# Patient Record
Sex: Male | Born: 1960 | Race: White | Hispanic: No | Marital: Married | Smoking: Former smoker
Health system: Southern US, Community
[De-identification: ages and names within clinical notes are randomized; demographics above are authoritative.]

## PROBLEM LIST (undated history)

## (undated) DIAGNOSIS — Z789 Other specified health status: Secondary | ICD-10-CM

## (undated) DIAGNOSIS — I10 Essential (primary) hypertension: Secondary | ICD-10-CM

## (undated) HISTORY — PX: NO PAST SURGERIES: SHX2092

---
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
## 2006-02-06 ENCOUNTER — Emergency Department (HOSPITAL_COMMUNITY): Admission: EM | Admit: 2006-02-06 | Discharge: 2006-02-06 | Payer: Self-pay | Admitting: Emergency Medicine

## 2010-08-26 ENCOUNTER — Ambulatory Visit (HOSPITAL_COMMUNITY): Admission: RE | Admit: 2010-08-26 | Discharge: 2010-08-26 | Payer: Self-pay | Admitting: Family Medicine

## 2010-09-01 ENCOUNTER — Ambulatory Visit (HOSPITAL_COMMUNITY): Admission: RE | Admit: 2010-09-01 | Discharge: 2010-09-01 | Payer: Self-pay | Admitting: Family Medicine

## 2011-03-18 ENCOUNTER — Other Ambulatory Visit: Payer: Self-pay | Admitting: Family Medicine

## 2011-03-18 DIAGNOSIS — R911 Solitary pulmonary nodule: Secondary | ICD-10-CM

## 2011-03-23 ENCOUNTER — Ambulatory Visit (HOSPITAL_COMMUNITY)
Admission: RE | Admit: 2011-03-23 | Discharge: 2011-03-23 | Disposition: A | Discharge status: Home / Self Care | Payer: BC Managed Care – PPO | Source: Ambulatory Visit | Attending: Family Medicine | Admitting: Family Medicine

## 2011-03-23 DIAGNOSIS — J984 Other disorders of lung: Secondary | ICD-10-CM | POA: Insufficient documentation

## 2011-03-23 DIAGNOSIS — R911 Solitary pulmonary nodule: Secondary | ICD-10-CM

## 2011-03-23 DIAGNOSIS — Z09 Encounter for follow-up examination after completed treatment for conditions other than malignant neoplasm: Secondary | ICD-10-CM | POA: Insufficient documentation

## 2011-03-23 MED ORDER — IOHEXOL 300 MG/ML  SOLN
80.0000 mL | Freq: Once | INTRAMUSCULAR | Status: AC | PRN
  Administered 2011-03-23: 80 mL via INTRAVENOUS

## 2012-05-18 ENCOUNTER — Other Ambulatory Visit: Payer: Self-pay | Admitting: Family Medicine

## 2012-05-18 DIAGNOSIS — R222 Localized swelling, mass and lump, trunk: Secondary | ICD-10-CM

## 2012-05-26 ENCOUNTER — Ambulatory Visit (HOSPITAL_COMMUNITY)
Admission: RE | Admit: 2012-05-26 | Discharge: 2012-05-26 | Disposition: A | Discharge status: Home / Self Care | Payer: BC Managed Care – PPO | Source: Ambulatory Visit | Attending: Family Medicine | Admitting: Family Medicine

## 2012-05-26 DIAGNOSIS — R222 Localized swelling, mass and lump, trunk: Secondary | ICD-10-CM

## 2012-05-26 DIAGNOSIS — R911 Solitary pulmonary nodule: Secondary | ICD-10-CM | POA: Insufficient documentation

## 2012-05-26 DIAGNOSIS — R937 Abnormal findings on diagnostic imaging of other parts of musculoskeletal system: Secondary | ICD-10-CM | POA: Insufficient documentation

## 2012-05-26 MED ORDER — IOHEXOL 300 MG/ML  SOLN
80.0000 mL | Freq: Once | INTRAMUSCULAR | Status: AC | PRN
  Administered 2012-05-26: 80 mL via INTRAVENOUS

## 2014-06-20 ENCOUNTER — Encounter: Payer: Self-pay | Admitting: Family Medicine

## 2014-06-20 ENCOUNTER — Ambulatory Visit (INDEPENDENT_AMBULATORY_CARE_PROVIDER_SITE_OTHER): Payer: BC Managed Care – PPO | Admitting: Family Medicine

## 2014-06-20 VITALS — BP 138/86 | Temp 98.1°F | Ht 71.0 in | Wt 217.0 lb

## 2014-06-20 DIAGNOSIS — R509 Fever, unspecified: Secondary | ICD-10-CM

## 2014-06-20 MED ORDER — DOXYCYCLINE HYCLATE 100 MG PO TABS: 100.0000 mg | ORAL_TABLET | Freq: Two times a day (BID) | ORAL | Status: DC

## 2014-06-20 NOTE — Progress Notes (Signed)
   Subjective:    Patient ID: Stephen Barnes, male    DOB: [birth year], 53 y.o.   MRN: [id number]  Fever  This is a new problem. The current episode started 1 to 4 weeks ago. The maximum temperature noted was 101 to 101.9 F. He has tried NSAIDs for the symptoms.    Started at Saturday  Mil headache  dinminished energy  Throat's inflammed  No tick bites  Some rasah on the back, question heat bumps,  Tries to keep up on fluids, regular headache  Review of Systems  Constitutional: Positive for fever.   No vomiting no diarrhea no change in bowel habits ROS otherwise negative    Objective:   Physical Exam Alert no apparent distress. Neck supple lungs Barnes. Heart rare rhythm HET normal abdomen benign. 3 nonspecific minimal rash low back       Assessment & Plan:  Impression febrile illness. 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 Discussed. Likely viral. But need to control in cover for tickborne illness plan Doxy 100 twice a day. Symptomatic care discussed. WSL

## 2015-12-03 ENCOUNTER — Telehealth: Payer: Self-pay | Admitting: Family Medicine

## 2015-12-03 DIAGNOSIS — Z125 Encounter for screening for malignant neoplasm of prostate: Secondary | ICD-10-CM

## 2015-12-03 DIAGNOSIS — Z13220 Encounter for screening for lipoid disorders: Secondary | ICD-10-CM

## 2015-12-03 DIAGNOSIS — R5383 Other fatigue: Secondary | ICD-10-CM

## 2015-12-03 NOTE — Telephone Encounter (Signed)
No labs in EPIC

## 2015-12-03 NOTE — Telephone Encounter (Signed)
Fatigue is fine

## 2015-12-03 NOTE — Telephone Encounter (Signed)
Lipid, met 7, PSA, CBC

## 2015-12-03 NOTE — Telephone Encounter (Signed)
Blood work ordered in EPIC. Patient notified. 

## 2015-12-03 NOTE — Telephone Encounter (Signed)
Pt is scheduled for a physical on 12/19/15, can we order lab work for patient to do before appt?  Please advise

## 2015-12-03 NOTE — Telephone Encounter (Signed)
Need dx for cbc please

## 2015-12-12 ENCOUNTER — Encounter: Payer: Self-pay | Admitting: Family Medicine

## 2015-12-12 LAB — CBC WITH DIFFERENTIAL/PLATELET
BASOS: 0 %
Basophils Absolute: 0 10*3/uL (ref 0.0–0.2)
EOS (ABSOLUTE): 0.1 10*3/uL (ref 0.0–0.4)
EOS: 2 %
Hematocrit: 43.2 % (ref 37.5–51.0)
Hemoglobin: 15 g/dL (ref 12.6–17.7)
IMMATURE GRANULOCYTES: 0 %
Immature Grans (Abs): 0 10*3/uL (ref 0.0–0.1)
LYMPHS: 36 %
Lymphocytes Absolute: 2 10*3/uL (ref 0.7–3.1)
MCH: 30.2 pg (ref 26.6–33.0)
MCHC: 34.7 g/dL (ref 31.5–35.7)
MCV: 87 fL (ref 79–97)
MONOCYTES: 7 %
Monocytes Absolute: 0.4 10*3/uL (ref 0.1–0.9)
NEUTROS ABS: 3 10*3/uL (ref 1.4–7.0)
NEUTROS PCT: 55 %
Platelets: 262 10*3/uL (ref 150–379)
RBC: 4.96 x10E6/uL (ref 4.14–5.80)
RDW: 13.9 % (ref 12.3–15.4)
WBC: 5.5 10*3/uL (ref 3.4–10.8)

## 2015-12-12 LAB — PSA: PROSTATE SPECIFIC AG, SERUM: 0.4 ng/mL (ref 0.0–4.0)

## 2015-12-12 LAB — LIPID PANEL
CHOLESTEROL TOTAL: 171 mg/dL (ref 100–199)
Chol/HDL Ratio: 4.6 ratio units (ref 0.0–5.0)
HDL: 37 mg/dL — ABNORMAL LOW (ref >39)
LDL CALC: 118 mg/dL — AB (ref 0–99)
Triglycerides: 81 mg/dL (ref 0–149)
VLDL Cholesterol Cal: 16 mg/dL (ref 5–40)

## 2015-12-12 LAB — BASIC METABOLIC PANEL
BUN / CREAT RATIO: 11 (ref 9–20)
BUN: 12 mg/dL (ref 6–24)
CHLORIDE: 103 mmol/L (ref 96–106)
CO2: 25 mmol/L (ref 18–29)
Calcium: 9.9 mg/dL (ref 8.7–10.2)
Creatinine, Ser: 1.14 mg/dL (ref 0.76–1.27)
GFR calc Af Amer: 84 mL/min/{1.73_m2} (ref >59)
GFR calc non Af Amer: 73 mL/min/{1.73_m2} (ref >59)
GLUCOSE: 103 mg/dL — AB (ref 65–99)
Potassium: 4.2 mmol/L (ref 3.5–5.2)
SODIUM: 144 mmol/L (ref 134–144)

## 2015-12-19 ENCOUNTER — Encounter: Payer: Self-pay | Admitting: Family Medicine

## 2015-12-19 ENCOUNTER — Ambulatory Visit (INDEPENDENT_AMBULATORY_CARE_PROVIDER_SITE_OTHER): Payer: BLUE CROSS/BLUE SHIELD | Admitting: Family Medicine

## 2015-12-19 VITALS — BP 144/96 | Ht 70.0 in | Wt 228.0 lb

## 2015-12-19 DIAGNOSIS — R739 Hyperglycemia, unspecified: Secondary | ICD-10-CM

## 2015-12-19 DIAGNOSIS — Z139 Encounter for screening, unspecified: Secondary | ICD-10-CM

## 2015-12-19 DIAGNOSIS — Z0000 Encounter for general adult medical examination without abnormal findings: Secondary | ICD-10-CM | POA: Diagnosis not present

## 2015-12-19 DIAGNOSIS — R5383 Other fatigue: Secondary | ICD-10-CM | POA: Diagnosis not present

## 2015-12-19 NOTE — Addendum Note (Signed)
Addended by: Carmelina Noun on: 12/19/2015 01:38 PM   Modules accepted: Orders

## 2015-12-19 NOTE — Progress Notes (Signed)
Orders put in for pt to do in 3 montsh

## 2015-12-19 NOTE — Patient Instructions (Signed)
DASH Eating Plan  DASH stands for "Dietary Approaches to Stop Hypertension." The DASH eating plan is a healthy eating plan that has been shown to reduce high blood pressure (hypertension). Additional health benefits may include reducing the risk of type 2 diabetes mellitus, heart disease, and stroke. The DASH eating plan may also help with weight loss.  WHAT DO I NEED TO KNOW ABOUT THE DASH EATING PLAN?  For the DASH eating plan, you will follow these general guidelines:  · Choose foods with a percent daily value for sodium of less than 5% (as listed on the food label).  · Use salt-free seasonings or herbs instead of table salt or sea salt.  · Check with your health care provider or pharmacist before using salt substitutes.  · Eat lower-sodium products, often labeled as "lower sodium" or "no salt added."  · Eat fresh foods.  · Eat more vegetables, fruits, and low-fat dairy products.  · Choose whole grains. Look for the word "whole" as the first word in the ingredient list.  · Choose fish and skinless chicken or turkey more often than red meat. Limit fish, poultry, and meat to 6 oz (170 g) each day.  · Limit sweets, desserts, sugars, and sugary drinks.  · Choose heart-healthy fats.  · Limit cheese to 1 oz (28 g) per day.  · Eat more home-cooked food and less restaurant, buffet, and fast food.  · Limit fried foods.  · Cook foods using methods other than frying.  · Limit canned vegetables. If you do use them, rinse them well to decrease the sodium.  · When eating at a restaurant, ask that your food be prepared with less salt, or no salt if possible.  WHAT FOODS CAN I EAT?  Seek help from a dietitian for individual calorie needs.  Grains  Whole grain or whole wheat bread. Brown rice. Whole grain or whole wheat pasta. Quinoa, bulgur, and whole grain cereals. Low-sodium cereals. Corn or whole wheat flour tortillas. Whole grain cornbread. Whole grain crackers. Low-sodium crackers.  Vegetables  Fresh or frozen vegetables  (raw, steamed, roasted, or grilled). Low-sodium or reduced-sodium tomato and vegetable juices. Low-sodium or reduced-sodium tomato sauce and paste. Low-sodium or reduced-sodium canned vegetables.   Fruits  All fresh, canned (in natural juice), or frozen fruits.  Meat and Other Protein Products  Ground beef (85% or leaner), grass-fed beef, or beef trimmed of fat. Skinless chicken or turkey. Ground chicken or turkey. Pork trimmed of fat. All fish and seafood. Eggs. Dried beans, peas, or lentils. Unsalted nuts and seeds. Unsalted canned beans.  Dairy  Low-fat dairy products, such as skim or 1% milk, 2% or reduced-fat cheeses, low-fat ricotta or cottage cheese, or plain low-fat yogurt. Low-sodium or reduced-sodium cheeses.  Fats and Oils  Tub margarines without trans fats. Light or reduced-fat mayonnaise and salad dressings (reduced sodium). Avocado. Safflower, olive, or canola oils. Natural peanut or almond butter.  Other  Unsalted popcorn and pretzels.  The items listed above may not be a complete list of recommended foods or beverages. Contact your dietitian for more options.  WHAT FOODS ARE NOT RECOMMENDED?  Grains  White bread. White pasta. White rice. Refined cornbread. Bagels and croissants. Crackers that contain trans fat.  Vegetables  Creamed or fried vegetables. Vegetables in a cheese sauce. Regular canned vegetables. Regular canned tomato sauce and paste. Regular tomato and vegetable juices.  Fruits  Dried fruits. Canned fruit in light or heavy syrup. Fruit juice.  Meat and Other Protein   Products  Fatty cuts of meat. Ribs, chicken wings, bacon, sausage, bologna, salami, chitterlings, fatback, hot dogs, bratwurst, and packaged luncheon meats. Salted nuts and seeds. Canned beans with salt.  Dairy  Whole or 2% milk, cream, half-and-half, and cream cheese. Whole-fat or sweetened yogurt. Full-fat cheeses or blue cheese. Nondairy creamers and whipped toppings. Processed cheese, cheese spreads, or cheese  curds.  Condiments  Onion and garlic salt, seasoned salt, table salt, and sea salt. Canned and packaged gravies. Worcestershire sauce. Tartar sauce. Barbecue sauce. Teriyaki sauce. Soy sauce, including reduced sodium. Steak sauce. Fish sauce. Oyster sauce. Cocktail sauce. Horseradish. Ketchup and mustard. Meat flavorings and tenderizers. Bouillon cubes. Hot sauce. Tabasco sauce. Marinades. Taco seasonings. Relishes.  Fats and Oils  Butter, stick margarine, lard, shortening, ghee, and bacon fat. Coconut, palm kernel, or palm oils. Regular salad dressings.  Other  Pickles and olives. Salted popcorn and pretzels.  The items listed above may not be a complete list of foods and beverages to avoid. Contact your dietitian for more information.  WHERE CAN I FIND MORE INFORMATION?  National Heart, Lung, and [employer] Institute: www.nhlbi.nih.gov/health/health-topics/topics/dash/     This information is not intended to replace advice given to you by your health care provider. Make sure you discuss any questions you have with your health care provider.     Document Released: 10/22/2011 Document Revised: 11/23/2014 Document Reviewed: 09/06/2013  Elsevier Interactive Patient Education ©2016 [employer] Inc[employer]

## 2015-12-19 NOTE — Progress Notes (Signed)
   Subjective:    Patient ID: Stephen Barnes, male    DOB: 1961/04/22, 55 y.o.   MRN: [id number]  HPI The patient comes in today for a wellness visit.    A review of their health history was completed.  A review of medications was also completed.  Any needed refills; not taking any meds  Eating habits: health conscious  Falls/  MVA accidents in past few months: none  Regular exercise: yes  Specialist pt sees on regular basis: none  Preventative health issues were discussed.   Additional concerns: none    Review of Systems  Constitutional: Negative for fever, activity change and appetite change.  HENT: Negative for congestion and rhinorrhea.   Eyes: Negative for discharge.  Respiratory: Negative for cough and wheezing.   Cardiovascular: Negative for chest pain.  Gastrointestinal: Negative for vomiting, abdominal pain and blood in stool.  Genitourinary: Negative for frequency and difficulty urinating.  Musculoskeletal: Negative for neck pain.  Skin: Negative for rash.  Allergic/Immunologic: Negative for environmental allergies and food allergies.  Neurological: Negative for weakness and headaches.  Psychiatric/Behavioral: Negative for agitation.       Objective:   Physical Exam  Constitutional: He appears well-developed and well-nourished.  HENT:  Head: Normocephalic and atraumatic.  Right Ear: External ear normal.  Left Ear: External ear normal.  Nose: Nose normal.  Mouth/Throat: Oropharynx is clear and moist.  Eyes: EOM are normal. Pupils are equal, round, and reactive to light.  Neck: Normal range of motion. Neck supple. No thyromegaly present.  Cardiovascular: Normal rate, regular rhythm and normal heart sounds.   No murmur heard. Pulmonary/Chest: Effort normal and breath sounds normal. No respiratory distress. He has no wheezes.  Abdominal: Soft. Bowel sounds are normal. He exhibits no distension and no mass. There is no tenderness.  Genitourinary: Rectum  normal, prostate normal and penis normal.  Musculoskeletal: Normal range of motion. He exhibits no edema.  Lymphadenopathy:    He has no cervical adenopathy.  Neurological: He is alert. He exhibits normal muscle tone.  Skin: Skin is warm and dry. No erythema.  Psychiatric: He has a normal mood and affect. His behavior is normal. Judgment normal.          Assessment & Plan:   wellness-safety dietary discussed importance of trying to lose some weight was discussed also we'll be setting him up with local gastroenterology for colonoscopy he does have a couple sun damage spots on his arms he is seen the gastric he has seen dermatology before he will set himself up with Dr. Nevada Crane his lab work overall looks good. He is not smoking. His blood pressure is borderline. I encouraged him to follow-up in approximate 3 months we will check glucose, thyroid function. Patient will follow-up in 3-4 months.

## 2015-12-20 ENCOUNTER — Encounter: Payer: Self-pay | Admitting: Family Medicine

## 2015-12-23 ENCOUNTER — Encounter (INDEPENDENT_AMBULATORY_CARE_PROVIDER_SITE_OTHER): Payer: Self-pay | Admitting: *Deleted

## 2016-01-09 ENCOUNTER — Other Ambulatory Visit (INDEPENDENT_AMBULATORY_CARE_PROVIDER_SITE_OTHER): Payer: Self-pay | Admitting: *Deleted

## 2016-01-09 DIAGNOSIS — Z1211 Encounter for screening for malignant neoplasm of colon: Secondary | ICD-10-CM

## 2016-01-09 DIAGNOSIS — Z800 Family history of malignant neoplasm of digestive organs: Secondary | ICD-10-CM

## 2016-02-17 ENCOUNTER — Other Ambulatory Visit: Payer: Self-pay | Admitting: Nurse Practitioner

## 2016-02-17 MED ORDER — HYDROCODONE-HOMATROPINE 5-1.5 MG/5ML PO SYRP: 5.0000 mL | ORAL_SOLUTION | ORAL | Status: DC | PRN

## 2016-02-21 ENCOUNTER — Encounter (INDEPENDENT_AMBULATORY_CARE_PROVIDER_SITE_OTHER): Payer: Self-pay | Admitting: *Deleted

## 2016-02-21 ENCOUNTER — Other Ambulatory Visit (INDEPENDENT_AMBULATORY_CARE_PROVIDER_SITE_OTHER): Payer: Self-pay | Admitting: *Deleted

## 2016-02-21 MED ORDER — PEG 3350-KCL-NA BICARB-NACL 420 G PO SOLR: 4000.0000 mL | Freq: Once | ORAL | Status: DC

## 2016-02-21 NOTE — Telephone Encounter (Signed)
trilye

## 2016-03-06 ENCOUNTER — Telehealth (INDEPENDENT_AMBULATORY_CARE_PROVIDER_SITE_OTHER): Payer: Self-pay | Admitting: *Deleted

## 2016-03-06 NOTE — Telephone Encounter (Signed)
Referring MD/PCP: scott luking   Procedure: tcs  Reason/Indication:  Screening, fam hx colon ca  Has patient had this procedure before?  no  If so, when, by whom and where?    Is there a family history of colon cancer?  Yes, brother  Who?  What age when diagnosed?    Is patient diabetic?   no      Does patient have prosthetic heart valve or mechanical valve?  no  Do you have a pacemaker?  no  Has patient ever had endocarditis? no  Has patient had joint replacement within last 12 months?  no  Does patient tend to be constipated or take laxatives? no  Does patient have a history of alcohol/drug use?  no  Is patient on Coumadin, Plavix and/or Aspirin? no  Medications: yes  Allergies: asa 81 mg daily  Medication Adjustment: asa 2 days  Procedure date & time: 04/01/16 at 830

## 2016-03-10 NOTE — Telephone Encounter (Signed)
agree

## 2016-03-11 DIAGNOSIS — R5383 Other fatigue: Secondary | ICD-10-CM | POA: Diagnosis not present

## 2016-03-11 DIAGNOSIS — R739 Hyperglycemia, unspecified: Secondary | ICD-10-CM | POA: Diagnosis not present

## 2016-03-12 LAB — GLUCOSE, RANDOM: Glucose: 83 mg/dL (ref 65–99)

## 2016-03-12 LAB — HEMOGLOBIN A1C
ESTIMATED AVERAGE GLUCOSE: 117 mg/dL
Hgb A1c MFr Bld: 5.7 % — ABNORMAL HIGH (ref 4.8–5.6)

## 2016-03-12 LAB — TSH: TSH: 3.61 u[IU]/mL (ref 0.450–4.500)

## 2016-03-17 ENCOUNTER — Ambulatory Visit (INDEPENDENT_AMBULATORY_CARE_PROVIDER_SITE_OTHER): Payer: BLUE CROSS/BLUE SHIELD | Admitting: Family Medicine

## 2016-03-17 ENCOUNTER — Encounter: Payer: Self-pay | Admitting: Family Medicine

## 2016-03-17 VITALS — BP 138/90 | Ht 70.0 in | Wt 225.0 lb

## 2016-03-17 DIAGNOSIS — E785 Hyperlipidemia, unspecified: Secondary | ICD-10-CM

## 2016-03-17 DIAGNOSIS — R030 Elevated blood-pressure reading, without diagnosis of hypertension: Secondary | ICD-10-CM | POA: Diagnosis not present

## 2016-03-17 DIAGNOSIS — IMO0001 Reserved for inherently not codable concepts without codable children: Secondary | ICD-10-CM

## 2016-03-17 NOTE — Progress Notes (Signed)
   Subjective:    Patient ID: Stephen Barnes, male    DOB: [birth year], 56 y.o.   MRN: [id number]  Hypertension This is a new problem. The current episode started more than 1 month ago. The problem has been gradually improving since onset. There are no associated agents to hypertension. There are no known risk factors for coronary artery disease. Past treatments include nothing. There are no compliance problems.    Patient states that he has no concerns at this time.  Patient states he has been doing a lot of walking try to lose some weight but he relates recently not walking as much not watching his diet as much Patient did have recent lab work this was reviewed with him does not show any diabetes thyroid looks good  Review of Systems Denies any chest tightness pressure pain shortness breath does not smoke    Objective:   Physical Exam Neck no masses lungs are clear no crackles heart is regular pulses normal extremities no edema skin warm dry blood pressure taken on 2 separate occasions proximally 142/90   I do not feel the patient needs blood pressure medicine currently I think is best approach is step 1 therapy which is diet and exercise    Assessment & Plan:  Glucose under much better control patient encouraged watch diet exercise Patient encouraged to lose more weight Elevated blood pressure get back and have a walking watch diet closely information given Check blood pressure on his own on over the course of next few weeks send [place] some readings as long as they're looking better no need for medication. Follow-up by late October.

## 2016-03-17 NOTE — Patient Instructions (Signed)
DASH Eating Plan  DASH stands for "Dietary Approaches to Stop Hypertension." The DASH eating plan is a healthy eating plan that has been shown to reduce high blood pressure (hypertension). Additional health benefits may include reducing the risk of type 2 diabetes mellitus, heart disease, and stroke. The DASH eating plan may also help with weight loss.  WHAT DO I NEED TO KNOW ABOUT THE DASH EATING PLAN?  For the DASH eating plan, you will follow these general guidelines:  · Choose foods with a percent daily value for sodium of less than 5% (as listed on the food label).  · Use salt-free seasonings or herbs instead of table salt or sea salt.  · Check with your health care provider or pharmacist before using salt substitutes.  · Eat lower-sodium products, often labeled as "lower sodium" or "no salt added."  · Eat fresh foods.  · Eat more vegetables, fruits, and low-fat dairy products.  · Choose whole grains. Look for the word "whole" as the first word in the ingredient list.  · Choose fish and skinless chicken or turkey more often than red meat. Limit fish, poultry, and meat to 6 oz (170 g) each day.  · Limit sweets, desserts, sugars, and sugary drinks.  · Choose heart-healthy fats.  · Limit cheese to 1 oz (28 g) per day.  · Eat more home-cooked food and less restaurant, buffet, and fast food.  · Limit fried foods.  · Cook foods using methods other than frying.  · Limit canned vegetables. If you do use them, rinse them well to decrease the sodium.  · When eating at a restaurant, ask that your food be prepared with less salt, or no salt if possible.  WHAT FOODS CAN I EAT?  Seek help from a dietitian for individual calorie needs.  Grains  Whole grain or whole wheat bread. Brown rice. Whole grain or whole wheat pasta. Quinoa, bulgur, and whole grain cereals. Low-sodium cereals. Corn or whole wheat flour tortillas. Whole grain cornbread. Whole grain crackers. Low-sodium crackers.  Vegetables  Fresh or frozen vegetables  (raw, steamed, roasted, or grilled). Low-sodium or reduced-sodium tomato and vegetable juices. Low-sodium or reduced-sodium tomato sauce and paste. Low-sodium or reduced-sodium canned vegetables.   Fruits  All fresh, canned (in natural juice), or frozen fruits.  Meat and Other Protein Products  Ground beef (85% or leaner), grass-fed beef, or beef trimmed of fat. Skinless chicken or turkey. Ground chicken or turkey. Pork trimmed of fat. All fish and seafood. Eggs. Dried beans, peas, or lentils. Unsalted nuts and seeds. Unsalted canned beans.  Dairy  Low-fat dairy products, such as skim or 1% milk, 2% or reduced-fat cheeses, low-fat ricotta or cottage cheese, or plain low-fat yogurt. Low-sodium or reduced-sodium cheeses.  Fats and Oils  Tub margarines without trans fats. Light or reduced-fat mayonnaise and salad dressings (reduced sodium). Avocado. Safflower, olive, or canola oils. Natural peanut or almond butter.  Other  Unsalted popcorn and pretzels.  The items listed above may not be a complete list of recommended foods or beverages. Contact your dietitian for more options.  WHAT FOODS ARE NOT RECOMMENDED?  Grains  White bread. White pasta. White rice. Refined cornbread. Bagels and croissants. Crackers that contain trans fat.  Vegetables  Creamed or fried vegetables. Vegetables in a cheese sauce. Regular canned vegetables. Regular canned tomato sauce and paste. Regular tomato and vegetable juices.  Fruits  Dried fruits. Canned fruit in light or heavy syrup. Fruit juice.  Meat and Other Protein   Products  Fatty cuts of meat. Ribs, chicken wings, bacon, sausage, bologna, salami, chitterlings, fatback, hot dogs, bratwurst, and packaged luncheon meats. Salted nuts and seeds. Canned beans with salt.  Dairy  Whole or 2% milk, cream, half-and-half, and cream cheese. Whole-fat or sweetened yogurt. Full-fat cheeses or blue cheese. Nondairy creamers and whipped toppings. Processed cheese, cheese spreads, or cheese  curds.  Condiments  Onion and garlic salt, seasoned salt, table salt, and sea salt. Canned and packaged gravies. Worcestershire sauce. Tartar sauce. Barbecue sauce. Teriyaki sauce. Soy sauce, including reduced sodium. Steak sauce. Fish sauce. Oyster sauce. Cocktail sauce. Horseradish. Ketchup and mustard. Meat flavorings and tenderizers. Bouillon cubes. Hot sauce. Tabasco sauce. Marinades. Taco seasonings. Relishes.  Fats and Oils  Butter, stick margarine, lard, shortening, ghee, and bacon fat. Coconut, palm kernel, or palm oils. Regular salad dressings.  Other  Pickles and olives. Salted popcorn and pretzels.  The items listed above may not be a complete list of foods and beverages to avoid. Contact your dietitian for more information.  WHERE CAN I FIND MORE INFORMATION?  National Heart, Lung, and [employer] Institute: www.nhlbi.nih.gov/health/health-topics/topics/dash/     This information is not intended to replace advice given to you by your health care provider. Make sure you discuss any questions you have with your health care provider.     Document Released: 10/22/2011 Document Revised: 11/23/2014 Document Reviewed: 09/06/2013  Elsevier Interactive Patient Education ©2016 [employer] Inc[employer]

## 2016-04-01 ENCOUNTER — Ambulatory Visit (HOSPITAL_COMMUNITY)
Admission: RE | Admit: 2016-04-01 | Discharge: 2016-04-01 | Disposition: A | Discharge status: Home / Self Care | Payer: BLUE CROSS/BLUE SHIELD | Source: Ambulatory Visit | Attending: Internal Medicine | Admitting: Internal Medicine

## 2016-04-01 ENCOUNTER — Encounter (HOSPITAL_COMMUNITY)
Admission: RE | Disposition: A | Discharge status: Home / Self Care | Payer: Self-pay | Source: Ambulatory Visit | Attending: Internal Medicine

## 2016-04-01 ENCOUNTER — Encounter (HOSPITAL_COMMUNITY): Payer: Self-pay

## 2016-04-01 DIAGNOSIS — K644 Residual hemorrhoidal skin tags: Secondary | ICD-10-CM | POA: Insufficient documentation

## 2016-04-01 DIAGNOSIS — Z800 Family history of malignant neoplasm of digestive organs: Secondary | ICD-10-CM

## 2016-04-01 DIAGNOSIS — K5730 Diverticulosis of large intestine without perforation or abscess without bleeding: Secondary | ICD-10-CM | POA: Diagnosis not present

## 2016-04-01 DIAGNOSIS — Z1211 Encounter for screening for malignant neoplasm of colon: Secondary | ICD-10-CM | POA: Insufficient documentation

## 2016-04-01 DIAGNOSIS — Z87891 Personal history of nicotine dependence: Secondary | ICD-10-CM | POA: Insufficient documentation

## 2016-04-01 DIAGNOSIS — D123 Benign neoplasm of transverse colon: Secondary | ICD-10-CM | POA: Diagnosis not present

## 2016-04-01 HISTORY — PX: COLONOSCOPY: SHX5424

## 2016-04-01 HISTORY — DX: Other specified health status: Z78.9

## 2016-04-01 SURGERY — COLONOSCOPY
Anesthesia: Moderate Sedation

## 2016-04-01 MED ORDER — MIDAZOLAM HCL 5 MG/5ML IJ SOLN
INTRAMUSCULAR | Status: AC
  Filled 2016-04-01: qty 10

## 2016-04-01 MED ORDER — STERILE WATER FOR IRRIGATION IR SOLN
Status: DC | PRN
  Administered 2016-04-01: 08:00:00

## 2016-04-01 MED ORDER — MEPERIDINE HCL 50 MG/ML IJ SOLN
INTRAMUSCULAR | Status: DC | PRN
  Administered 2016-04-01 (×2): 25 mg via INTRAVENOUS

## 2016-04-01 MED ORDER — MIDAZOLAM HCL 5 MG/5ML IJ SOLN
INTRAMUSCULAR | Status: DC | PRN
  Administered 2016-04-01: 2 mg via INTRAVENOUS
  Administered 2016-04-01 (×2): 3 mg via INTRAVENOUS
  Administered 2016-04-01: 2 mg via INTRAVENOUS

## 2016-04-01 MED ORDER — SODIUM CHLORIDE 0.9 % IV SOLN
INTRAVENOUS | Status: DC
  Administered 2016-04-01: 08:00:00 via INTRAVENOUS

## 2016-04-01 MED ORDER — MEPERIDINE HCL 50 MG/ML IJ SOLN
INTRAMUSCULAR | Status: AC
  Filled 2016-04-01: qty 1

## 2016-04-01 NOTE — Op Note (Signed)
[facility] Patient Name: Stephen Barnes Procedure Date: 04/01/2016 8:24 AM MRN: [id number] Date of Birth: 08-31-1961 Attending MD: Hildred Laser , MD CSN: [id number] Age: 55 Admit Type: Outpatient Procedure:                Colonoscopy Indications:              Screening in patient at increased risk: Colorectal                            cancer in brother before age 52 Providers:                Hildred Laser, MD, Rosina Lowenstein, RN, Isabella Stalling, Technician Referring MD:             Elayne Snare. Wolfgang Phoenix, MD Medicines:                Meperidine 50 mg IV, Midazolam 10 mg IV Complications:            No immediate complications. Estimated Blood Loss:     Estimated blood loss was minimal. Procedure:                Pre-Anesthesia Assessment:                           - Prior to the procedure, a History and Physical                            was performed, and patient medications and                            allergies were reviewed. The patient's tolerance of                            previous anesthesia was also reviewed. The risks                            and benefits of the procedure and the sedation                            options and risks were discussed with the patient.                            All questions were answered, and informed consent                            was obtained. Prior Anticoagulants: The patient has                            taken no previous anticoagulant or antiplatelet                            agents. ASA Grade Assessment: I - A normal, healthy  patient. After reviewing the risks and benefits,                            the patient was deemed in satisfactory condition to                            undergo the procedure.                           After obtaining informed consent, the colonoscope                            was passed under direct vision. Throughout the                             procedure, the patient's blood pressure, pulse, and                            oxygen saturations were monitored continuously. The                            [id number]) was introduced through the                            anus and advanced to the the cecum, identified by                            appendiceal orifice and ileocecal valve. The                            colonoscopy was performed without difficulty. The                            patient tolerated the procedure well. The quality                            of the bowel preparation was excellent. The                            ileocecal valve, appendiceal orifice, and rectum                            were photographed. Scope In: 8:33:25 AM Scope Out: 8:53:58 AM Scope Withdrawal Time: 0 hours 11 minutes 23 seconds  Total Procedure Duration: 0 hours 20 minutes 33 seconds  Findings:      One medium-mouthed diverticulum was found in the sigmoid colon and cecum.      A 3 mm polyp was found in the hepatic flexure. The polyp was sessile.       Biopsies were taken with a cold forceps for histology.      A 7 mm polyp was found in the transverse colon. The polyp was sessile.       The polyp was removed with a hot snare. Resection and retrieval were       complete.      External  hemorrhoids were found during retroflexion. The hemorrhoids       were small. Impression:               - Diverticulosis in the sigmoid colon and in the                            cecum (patient had 2 diverticula.ne at cecum and                            the other one at sigmoid colon).                           - One 3 mm polyp at the hepatic flexure. Biopsied.                           - One 7 mm polyp in the transverse colon, removed                            with a hot snare. Resected and retrieved.                           - External hemorrhoids. Moderate Sedation:      Moderate (conscious) sedation was administered by the endoscopy nurse        and supervised by the endoscopist. The following parameters were       monitored: oxygen saturation, heart rate, blood pressure, CO2       capnography and response to care. Total physician intraservice time was       26 minutes. Recommendation:           - Patient has a contact number available for                            emergencies. The signs and symptoms of potential                            delayed complications were discussed with the                            patient. Return to normal activities tomorrow.                            Written discharge instructions were provided to the                            patient.                           - Resume previous diet today.                           - No aspirin, ibuprofen, naproxen, or other                            non-steroidal anti-inflammatory drugs for 7 days  after polyp removal.                           - Repeat colonoscopy in 5 years for surveillance.                           - Await pathology results. Procedure Code(s):        --- Professional ---                           [phone], Colonoscopy, flexible; with removal of                            tumor(s), polyp(s), or other lesion(s) by snare                            technique                           45380, 59, Colonoscopy, flexible; with biopsy,                            single or multiple                           99152, Moderate sedation services provided by the                            same physician or other qualified health care                            professional performing the diagnostic or                            therapeutic service that the sedation supports,                            requiring the presence of an independent trained                            observer to assist in the monitoring of the                            patient's level of consciousness and physiological                             status; initial 15 minutes of intraservice time,                            patient age 61 years or older                           [phone], Moderate sedation services; each additional                            15 minutes intraservice time  Diagnosis Code(s):        --- Professional ---                           Z80.0, Family history of malignant neoplasm of                            digestive organs                           K64.4, Residual hemorrhoidal skin tags                           D12.3, Benign neoplasm of transverse colon (hepatic                            flexure or splenic flexure)                           K57.30, Diverticulosis of large intestine without                            perforation or abscess without bleeding CPT copyright 2016 American Medical Association. All rights reserved. The codes documented in this report are preliminary and upon coder review may  be revised to meet current compliance requirements. Hildred Laser, MD Hildred Laser, MD 04/01/2016 9:05:32 AM This report has been signed electronically. Number of Addenda: 0

## 2016-04-01 NOTE — H&P (Signed)
Stephen Barnes is an 55 y.o. male.   Chief Complaint: Patient is here for colonoscopy. HPI: Patient is 55 year old Caucasian male who is screening colonoscopy. He denies abdominal pain change in bowel habits or rectal bleeding. This is patient's first exam. His brother was diagnosed with colon carcinoma in late 22s and died at 37.  Past Medical History  Diagnosis Date  . Medical history non-contributory     Past Surgical History  Procedure Laterality Date  . No past surgeries      No family history on file. Social History:  reports that he has quit smoking. He does not have any smokeless tobacco history on file. He reports that he does not drink alcohol or use illicit drugs.  Allergies: No Known Allergies  No prescriptions prior to admission    No results found for this or any previous visit (from the past 48 hour(s)). No results found.  ROS  Blood pressure 154/95, pulse 69, temperature 97.7 F (36.5 C), temperature source Oral, resp. rate 16, SpO2 97 %. Physical Exam  Constitutional: He appears well-developed and well-nourished.  HENT:  Mouth/Throat: Oropharynx is clear and moist.  Eyes: Conjunctivae are normal. No scleral icterus.  Neck: No thyromegaly present.  Cardiovascular: Normal rate, regular rhythm and normal heart sounds.   No murmur heard. Respiratory: Effort normal and breath sounds normal.  GI: Soft. He exhibits no distension and no mass. There is no tenderness.  Musculoskeletal: He exhibits no edema.  Lymphadenopathy:    He has no cervical adenopathy.  Neurological: He is alert.  Skin: Skin is warm and dry.     Assessment/Plan High risk screening colonoscopy. Family history of CRC and brother younger than age 59 at the time of diagnosis.  Stephen Houston, MD 04/01/2016, 8:24 AM

## 2016-04-01 NOTE — Discharge Instructions (Signed)
No aspirin or NSAIDs for 1 week. Resume usual diet. No driving for 24 hours. Physician will call with biopsy results.   Colonoscopy, Care After These instructions give you information on caring for yourself after your procedure. Your doctor may also give you more specific instructions. Call your doctor if you have any problems or questions after your procedure. HOME CARE  Do not drive for 24 hours.  Do not sign important papers or use machinery for 24 hours.  You may shower.  You may go back to your usual activities, but go slower for the first 24 hours.  Take rest breaks often during the first 24 hours.  Walk around or use warm packs on your belly (abdomen) if you have belly cramping or gas.  Drink enough fluids to keep your pee (urine) clear or pale yellow.  Resume your normal diet. Avoid heavy or fried foods.  Avoid drinking alcohol for 24 hours or as told by your doctor.  Only take medicines as told by your doctor. If a tissue sample (biopsy) was taken during the procedure:   Do not take aspirin or blood thinners for 7 days, or as told by your doctor.  Do not drink alcohol for 7 days, or as told by your doctor.  Eat soft foods for the first 24 hours. GET HELP IF: You still have a small amount of blood in your poop (stool) 2-3 days after the procedure. GET HELP RIGHT AWAY IF:  You have more than a small amount of blood in your poop.  You see clumps of tissue (blood clots) in your poop.  Your belly is puffy (swollen).  You feel sick to your stomach (nauseous) or throw up (vomit).  You have a fever.  You have belly pain that gets worse and medicine does not help. MAKE SURE YOU:  Understand these instructions.  Will watch your condition.  Will get help right away if you are not doing well or get worse.   This information is not intended to replace advice given to you by your health care provider. Make sure you discuss any questions you have with your health  care provider.   Document Released: 12/05/2010 Document Revised: 11/07/2013 Document Reviewed: 07/10/2013 Elsevier Interactive Patient Education [employer].  Diverticulosis Diverticulosis is the condition that develops when small pouches (diverticula) form in the wall of your colon. Your colon, or large intestine, is where water is absorbed and stool is formed. The pouches form when the inside layer of your colon pushes through weak spots in the outer layers of your colon. CAUSES  No one knows exactly what causes diverticulosis. RISK FACTORS Being older than 74. Your risk for this condition increases with age. Diverticulosis is rare in people younger than 40 years. By age 44, almost everyone has it. Eating a low-fiber diet. Being frequently constipated. Being overweight. Not getting enough exercise. Smoking. Taking over-the-counter pain medicines, like aspirin and ibuprofen. SYMPTOMS  Most people with diverticulosis do not have symptoms. DIAGNOSIS  Because diverticulosis often has no symptoms, health care providers often discover the condition during an exam for other colon problems. In many cases, a health care provider will diagnose diverticulosis while using a flexible scope to examine the colon (colonoscopy). TREATMENT  If you have never developed an infection related to diverticulosis, you may not need treatment. If you have had an infection before, treatment may include: Eating more fruits, vegetables, and grains. Taking a fiber supplement. Taking a live bacteria supplement (probiotic). Taking medicine  to relax your colon. HOME CARE INSTRUCTIONS  Drink at least 6-8 glasses of water each day to prevent constipation. Try not to strain when you have a bowel movement. Keep all follow-up appointments. If you have had an infection before: Increase the fiber in your diet as directed by your health care provider or dietitian. Take a dietary fiber supplement if your health care  provider approves. Only take medicines as directed by your health care provider. SEEK MEDICAL CARE IF:  You have abdominal pain. You have bloating. You have cramps. You have not gone to the bathroom in 3 days. SEEK IMMEDIATE MEDICAL CARE IF:  Your pain gets worse. Yourbloating becomes very bad. You have a fever or chills, and your symptoms suddenly get worse. You begin vomiting. You have bowel movements that are bloody or black. MAKE SURE YOU: Understand these instructions. Will watch your condition. Will get help right away if you are not doing well or get worse.   This information is not intended to replace advice given to you by your health care provider. Make sure you discuss any questions you have with your health care provider.   Document Released: 07/30/2004 Document Revised: 11/07/2013 Document Reviewed: 09/27/2013 Elsevier Interactive Patient Education 2016 [employer] Colon Polyps Polyps are lumps of extra tissue growing inside the body. Polyps can grow in the large intestine (colon). Most colon polyps are noncancerous (benign). However, some colon polyps can become cancerous over time. Polyps that are larger than a pea may be harmful. To be safe, caregivers remove and test all polyps. CAUSES  Polyps form when mutations in the genes cause your cells to grow and divide even though no more tissue is needed. RISK FACTORS There are a number of risk factors that can increase your chances of getting colon polyps. They include:  Being older than 50 years.  Family history of colon polyps or colon cancer.  Long-term colon diseases, such as colitis or Crohn disease.  Being overweight.  Smoking.  Being inactive.  Drinking too much alcohol. SYMPTOMS  Most small polyps do not cause symptoms. If symptoms are present, they may include:  Blood in the stool. The stool may look dark red or black.  Constipation or diarrhea that lasts longer than 1 week. DIAGNOSIS People  often do not know they have polyps until their caregiver finds them during a regular checkup. Your caregiver can use 4 tests to check for polyps:  Digital rectal exam. The caregiver wears gloves and feels inside the rectum. This test would find polyps only in the rectum.  Barium enema. The caregiver puts a liquid called barium into your rectum before taking X-rays of your colon. Barium makes your colon look white. Polyps are dark, so they are easy to see in the X-ray pictures.  Sigmoidoscopy. A thin, flexible tube (sigmoidoscope) is placed into your rectum. The sigmoidoscope has a light and tiny camera in it. The caregiver uses the sigmoidoscope to look at the last third of your colon.  Colonoscopy. This test is like sigmoidoscopy, but the caregiver looks at the entire colon. This is the most common method for finding and removing polyps. TREATMENT  Any polyps will be removed during a sigmoidoscopy or colonoscopy. The polyps are then tested for cancer. PREVENTION  To help lower your risk of getting more colon polyps:  Eat plenty of fruits and vegetables. Avoid eating fatty foods.  Do not smoke.  Avoid drinking alcohol.  Exercise every day.  Lose weight if recommended by your  caregiver.  Eat plenty of calcium and folate. Foods that are rich in calcium include milk, cheese, and broccoli. Foods that are rich in folate include chickpeas, kidney beans, and spinach. HOME CARE INSTRUCTIONS Keep all follow-up appointments as directed by your caregiver. You may need periodic exams to check for polyps. SEEK MEDICAL CARE IF: You notice bleeding during a bowel movement.   This information is not intended to replace advice given to you by your health care provider. Make sure you discuss any questions you have with your health care provider.   Document Released: 07/29/2004 Document Revised: 11/23/2014 Document Reviewed: 01/12/2012 Elsevier Interactive Patient Education 2016 [employer].   Hemorrhoids Hemorrhoids are swollen veins around the rectum or anus. There are two types of hemorrhoids:   Internal hemorrhoids. These occur in the veins just inside the rectum. They may poke through to the outside and become irritated and painful.  External hemorrhoids. These occur in the veins outside the anus and can be felt as a painful swelling or hard lump near the anus. CAUSES  Pregnancy.   Obesity.   Constipation or diarrhea.   Straining to have a bowel movement.   Sitting for long periods on the toilet.  Heavy lifting or other activity that caused you to strain.  Anal intercourse. SYMPTOMS   Pain.   Anal itching or irritation.   Rectal bleeding.   Fecal leakage.   Anal swelling.   One or more lumps around the anus.  DIAGNOSIS  Your caregiver may be able to diagnose hemorrhoids by visual examination. Other examinations or tests that may be performed include:   Examination of the rectal area with a gloved hand (digital rectal exam).   Examination of anal canal using a small tube (scope).   A blood test if you have lost a significant amount of blood.  A test to look inside the colon (sigmoidoscopy or colonoscopy). TREATMENT Most hemorrhoids can be treated at home. However, if symptoms do not seem to be getting better or if you have a lot of rectal bleeding, your caregiver may perform a procedure to help make the hemorrhoids get smaller or remove them completely. Possible treatments include:   Placing a rubber band at the base of the hemorrhoid to cut off the circulation (rubber band ligation).   Injecting a chemical to shrink the hemorrhoid (sclerotherapy).   Using a tool to burn the hemorrhoid (infrared light therapy).   Surgically removing the hemorrhoid (hemorrhoidectomy).   Stapling the hemorrhoid to block blood flow to the tissue (hemorrhoid stapling).  HOME CARE INSTRUCTIONS   Eat foods with fiber, such as whole grains,  beans, nuts, fruits, and vegetables. Ask your doctor about taking products with added fiber in them (fibersupplements).  Increase fluid intake. Drink enough water and fluids to keep your urine clear or pale yellow.   Exercise regularly.   Go to the bathroom when you have the urge to have a bowel movement. Do not wait.   Avoid straining to have bowel movements.   Keep the anal area dry and clean. Use wet toilet paper or moist towelettes after a bowel movement.   Medicated creams and suppositories may be used or applied as directed.   Only take over-the-counter or prescription medicines as directed by your caregiver.   Take warm sitz baths for 15-20 minutes, 3-4 times a day to ease pain and discomfort.   Place ice packs on the hemorrhoids if they are tender and swollen. Using ice packs between sitz  baths may be helpful.   Put ice in a plastic bag.   Place a towel between your skin and the bag.   Leave the ice on for 15-20 minutes, 3-4 times a day.   Do not use a donut-shaped pillow or sit on the toilet for long periods. This increases blood pooling and pain.  SEEK MEDICAL CARE IF:  You have increasing pain and swelling that is not controlled by treatment or medicine.  You have uncontrolled bleeding.  You have difficulty or you are unable to have a bowel movement.  You have pain or inflammation outside the area of the hemorrhoids. MAKE SURE YOU:  Understand these instructions.  Will watch your condition.  Will get help right away if you are not doing well or get worse.   This information is not intended to replace advice given to you by your health care provider. Make sure you discuss any questions you have with your health care provider.   Document Released: 10/30/2000 Document Revised: 10/19/2012 Document Reviewed: 09/06/2012 Elsevier Interactive Patient Education [employer].

## 2016-04-03 ENCOUNTER — Encounter (HOSPITAL_COMMUNITY): Payer: Self-pay | Admitting: Internal Medicine

## 2016-09-17 ENCOUNTER — Ambulatory Visit: Payer: BLUE CROSS/BLUE SHIELD | Admitting: Family Medicine

## 2016-09-22 ENCOUNTER — Ambulatory Visit (HOSPITAL_COMMUNITY)
Admission: RE | Admit: 2016-09-22 | Discharge: 2016-09-22 | Disposition: A | Discharge status: Home / Self Care | Payer: BLUE CROSS/BLUE SHIELD | Source: Ambulatory Visit | Attending: Family Medicine | Admitting: Family Medicine

## 2016-09-22 ENCOUNTER — Encounter: Payer: Self-pay | Admitting: Family Medicine

## 2016-09-22 ENCOUNTER — Ambulatory Visit (INDEPENDENT_AMBULATORY_CARE_PROVIDER_SITE_OTHER): Payer: BLUE CROSS/BLUE SHIELD | Admitting: Family Medicine

## 2016-09-22 VITALS — BP 130/82 | Ht 70.0 in | Wt 210.0 lb

## 2016-09-22 DIAGNOSIS — R030 Elevated blood-pressure reading, without diagnosis of hypertension: Secondary | ICD-10-CM | POA: Diagnosis not present

## 2016-09-22 DIAGNOSIS — E7849 Other hyperlipidemia: Secondary | ICD-10-CM

## 2016-09-22 DIAGNOSIS — Z131 Encounter for screening for diabetes mellitus: Secondary | ICD-10-CM

## 2016-09-22 DIAGNOSIS — S46011A Strain of muscle(s) and tendon(s) of the rotator cuff of right shoulder, initial encounter: Secondary | ICD-10-CM

## 2016-09-22 DIAGNOSIS — R059 Cough, unspecified: Secondary | ICD-10-CM

## 2016-09-22 DIAGNOSIS — E784 Other hyperlipidemia: Secondary | ICD-10-CM

## 2016-09-22 DIAGNOSIS — R05 Cough: Secondary | ICD-10-CM
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
## 2016-09-23 DIAGNOSIS — Z131 Encounter for screening for diabetes mellitus: Secondary | ICD-10-CM | POA: Diagnosis not present

## 2016-09-23 DIAGNOSIS — E784 Other hyperlipidemia: Secondary | ICD-10-CM | POA: Diagnosis not present

## 2016-09-24 ENCOUNTER — Encounter: Payer: Self-pay | Admitting: Family Medicine

## 2016-09-24 LAB — LIPID PANEL
CHOLESTEROL TOTAL: 171 mg/dL (ref 100–199)
Chol/HDL Ratio: 4 ratio units (ref 0.0–5.0)
HDL: 43 mg/dL (ref >39)
LDL Calculated: 109 mg/dL — ABNORMAL HIGH (ref 0–99)
Triglycerides: 96 mg/dL (ref 0–149)
VLDL Cholesterol Cal: 19 mg/dL (ref 5–40)

## 2016-09-24 LAB — GLUCOSE, RANDOM: GLUCOSE: 88 mg/dL (ref 65–99)

## 2016-10-12 ENCOUNTER — Telehealth: Payer: Self-pay | Admitting: Family Medicine

## 2016-10-12 DIAGNOSIS — M25512 Pain in left shoulder: Principal | ICD-10-CM

## 2016-10-12 DIAGNOSIS — M25511 Pain in right shoulder: Secondary | ICD-10-CM

## 2016-10-12 NOTE — Telephone Encounter (Signed)
Spoke with patient and informed him per Dr.Scott Luking- Certainly we want to help- the last time Dr.Scott  saw you he  felt you were  having a rotator strain on the right side. Dr.Scott  would recommend continuing the exercises. Next step would be referral to orthopedics in more than likely they will try injections first. If that does not help then they will proceed forward to MRI. with these type of issues typically insurance companies will have the patient do one shoulder at a time when it comes to MRIs. Patient verbalized understanding.

## 2016-10-12 NOTE — Telephone Encounter (Signed)
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 I would recommend continuing the exercises. Next step would be referral to orthopedics in more than likely they will try injections first. If that does not help then they will proceed forward to MRI. with these type of issues typically insurance companies will have the patient do one shoulder at a time when it comes to MRIs. Please see a patient has any preference regarding orthopedics or do they want [place] to recommend.

## 2016-10-12 NOTE — Telephone Encounter (Signed)
Patient seen for shoulder issues on 09/22/16.  He is still having trouble with his shoulder and would like MRI ordered.  Also, he is starting to have trouble with both shoulders now and wants to know if MRI will cover both?

## 2016-10-16 ENCOUNTER — Encounter: Payer: Self-pay | Admitting: Family Medicine

## 2016-11-04 DIAGNOSIS — S46811A Strain of other muscles, fascia and tendons at shoulder and upper arm level, right arm, initial encounter: Secondary | ICD-10-CM | POA: Diagnosis not present

## 2016-11-14 DIAGNOSIS — S46811A Strain of other muscles, fascia and tendons at shoulder and upper arm level, right arm, initial encounter: Secondary | ICD-10-CM | POA: Diagnosis not present

## 2016-11-19 DIAGNOSIS — S46011D Strain of muscle(s) and tendon(s) of the rotator cuff of right shoulder, subsequent encounter: Secondary | ICD-10-CM | POA: Diagnosis not present

## 2016-11-19 DIAGNOSIS — S46811D Strain of other muscles, fascia and tendons at shoulder and upper arm level, right arm, subsequent encounter: Secondary | ICD-10-CM | POA: Diagnosis not present

## 2016-12-08 DIAGNOSIS — M24111 Other articular cartilage disorders, right shoulder: Secondary | ICD-10-CM | POA: Diagnosis not present

## 2016-12-08 DIAGNOSIS — Z4789 Encounter for other orthopedic aftercare: Secondary | ICD-10-CM | POA: Diagnosis not present

## 2016-12-08 DIAGNOSIS — S46011A Strain of muscle(s) and tendon(s) of the rotator cuff of right shoulder, initial encounter: Secondary | ICD-10-CM | POA: Diagnosis not present

## 2016-12-08 DIAGNOSIS — S46091A Other injury of muscle(s) and tendon(s) of the rotator cuff of right shoulder, initial encounter: Secondary | ICD-10-CM | POA: Diagnosis not present

## 2016-12-08 DIAGNOSIS — M75121 Complete rotator cuff tear or rupture of right shoulder, not specified as traumatic: Secondary | ICD-10-CM | POA: Diagnosis not present

## 2016-12-08 DIAGNOSIS — M7541 Impingement syndrome of right shoulder: Secondary | ICD-10-CM | POA: Diagnosis not present

## 2016-12-08 DIAGNOSIS — S46811A Strain of other muscles, fascia and tendons at shoulder and upper arm level, right arm, initial encounter: Secondary | ICD-10-CM | POA: Diagnosis not present

## 2016-12-08 DIAGNOSIS — M7521 Bicipital tendinitis, right shoulder: Secondary | ICD-10-CM | POA: Diagnosis not present

## 2016-12-08 DIAGNOSIS — M19011 Primary osteoarthritis, right shoulder: Secondary | ICD-10-CM | POA: Diagnosis not present

## 2016-12-08 DIAGNOSIS — G8918 Other acute postprocedural pain: Secondary | ICD-10-CM | POA: Diagnosis not present

## 2017-01-19 DIAGNOSIS — M25511 Pain in right shoulder: Secondary | ICD-10-CM | POA: Diagnosis not present

## 2017-01-21 DIAGNOSIS — M25511 Pain in right shoulder: Secondary | ICD-10-CM | POA: Diagnosis not present

## 2017-01-28 DIAGNOSIS — M25511 Pain in right shoulder: Secondary | ICD-10-CM | POA: Diagnosis not present

## 2017-02-01 DIAGNOSIS — M25511 Pain in right shoulder: Secondary | ICD-10-CM | POA: Diagnosis not present

## 2017-02-04 DIAGNOSIS — M25511 Pain in right shoulder: Secondary | ICD-10-CM | POA: Diagnosis not present

## 2017-02-10 DIAGNOSIS — M25511 Pain in right shoulder: Secondary | ICD-10-CM | POA: Diagnosis not present

## 2017-02-15 DIAGNOSIS — M25511 Pain in right shoulder: Secondary | ICD-10-CM | POA: Diagnosis not present

## 2017-02-18 DIAGNOSIS — M25511 Pain in right shoulder: Secondary | ICD-10-CM | POA: Diagnosis not present

## 2017-02-19 ENCOUNTER — Ambulatory Visit (INDEPENDENT_AMBULATORY_CARE_PROVIDER_SITE_OTHER): Payer: BLUE CROSS/BLUE SHIELD | Admitting: Family Medicine

## 2017-02-19 ENCOUNTER — Encounter: Payer: Self-pay | Admitting: Family Medicine

## 2017-02-19 VITALS — BP 150/98 | Ht 70.0 in | Wt 235.0 lb

## 2017-02-19 DIAGNOSIS — I10 Essential (primary) hypertension: Secondary | ICD-10-CM

## 2017-02-19 DIAGNOSIS — Z125 Encounter for screening for malignant neoplasm of prostate: Secondary | ICD-10-CM | POA: Diagnosis not present

## 2017-02-19 DIAGNOSIS — H9193 Unspecified hearing loss, bilateral: Secondary | ICD-10-CM

## 2017-02-19 MED ORDER — LISINOPRIL 5 MG PO TABS: 5.0000 mg | ORAL_TABLET | Freq: Every day | ORAL | 4 refills | Status: DC

## 2017-02-19 NOTE — Progress Notes (Signed)
   Subjective:    Patient ID: Stephen Barnes, male    DOB: 11/09/1961, 56 y.o.   MRN: [id number]  Hypertension  This is a new problem. Episode onset: 2 weeks. Pertinent negatives include no chest pain, headaches or shortness of breath. (Ears ringing) Compliance problems: eats healthy, tries to exercise.   had surgery on shoulder and was not able to do much. Gained some weight Patient is noticed significant hearing troubles over the past several months with some reading in the years he is around a lot of loud equipment and noises. He denies any chest tightness pressure pain shortness breath Review of Systems  Constitutional: Negative for activity change, fatigue and fever.  Respiratory: Negative for cough and shortness of breath.   Cardiovascular: Negative for chest pain and leg swelling.  Neurological: Negative for headaches.       Objective:   Physical Exam  Constitutional: He appears well-nourished. No distress.  Cardiovascular: Normal rate, regular rhythm and normal heart sounds.   No murmur heard. Pulmonary/Chest: Effort normal and breath sounds normal. No respiratory distress.  Musculoskeletal: He exhibits no edema.  Lymphadenopathy:    He has no cervical adenopathy.  Neurological: He is alert.  Psychiatric: His behavior is normal.  Vitals reviewed.   Blood pressure reading taken with his monitor as well as R wall unit and they're very similar  Hearing screen shows significant hearing deficit    Assessment & Plan:  Shoulder pain and discomfort going through therapy currently has finished his surgery  Hypertension this is related to gaining weight and activity he needs to become more active watch diet watch portions lose weight. Start Prinivil 5 mg side effects including cough or possible angioedema was discuss take 1 every single day. Lab work ordered. Wife is a send [place] blood pressure readings in 2 weeks' time patient is to follow-up within 6 weeks time  Referral to ENT Dr  Benjamine Mola for further evaluation hearing test etc.

## 2017-02-19 NOTE — Patient Instructions (Signed)
DASH Eating Plan DASH stands for "Dietary Approaches to Stop Hypertension." The DASH eating plan is a healthy eating plan that has been shown to reduce high blood pressure (hypertension). It may also reduce your risk for type 2 diabetes, heart disease, and stroke. The DASH eating plan may also help with weight loss. What are tips for following this plan? General guidelines  Avoid eating more than 2,300 mg (milligrams) of salt (sodium) a day. If you have hypertension, you may need to reduce your sodium intake to 1,500 mg a day.  Limit alcohol intake to no more than 1 drink a day for nonpregnant women and 2 drinks a day for men. One drink equals 12 oz of beer, 5 oz of wine, or 1 oz of hard liquor.  Work with your health care provider to maintain a healthy body weight or to lose weight. Ask what an ideal weight is for you.  Get at least 30 minutes of exercise that causes your heart to beat faster (aerobic exercise) most days of the week. Activities may include walking, swimming, or biking.  Work with your health care provider or diet and nutrition specialist (dietitian) to adjust your eating plan to your individual calorie needs. Reading food labels  Check food labels for the amount of sodium per serving. Choose foods with less than 5 percent of the Daily Value of sodium. Generally, foods with less than 300 mg of sodium per serving fit into this eating plan.  To find whole grains, look for the word "whole" as the first word in the ingredient list. Shopping  Buy products labeled as "low-sodium" or "no salt added."  Buy fresh foods. Avoid canned foods and premade or frozen meals. Cooking  Avoid adding salt when cooking. Use salt-free seasonings or herbs instead of table salt or sea salt. Check with your health care provider or pharmacist before using salt substitutes.  Do not fry foods. Cook foods using healthy methods such as baking, boiling, grilling, and broiling instead.  Cook with  heart-healthy oils, such as olive, canola, soybean, or sunflower oil. Meal planning   Eat a balanced diet that includes: ? 5 or more servings of fruits and vegetables each day. At each meal, try to fill half of your plate with fruits and vegetables. ? Up to 6-8 servings of whole grains each day. ? Less than 6 oz of lean meat, poultry, or fish each day. A 3-oz serving of meat is about the same size as a deck of cards. One egg equals 1 oz. ? 2 servings of low-fat dairy each day. ? A serving of nuts, seeds, or beans 5 times each week. ? Heart-healthy fats. Healthy fats called Omega-3 fatty acids are found in foods such as flaxseeds and coldwater fish, like sardines, salmon, and mackerel.  Limit how much you eat of the following: ? Canned or prepackaged foods. ? Food that is high in trans fat, such as fried foods. ? Food that is high in saturated fat, such as fatty meat. ? Sweets, desserts, sugary drinks, and other foods with added sugar. ? Full-fat dairy products.  Do not salt foods before eating.  Try to eat at least 2 vegetarian meals each week.  Eat more home-cooked food and less restaurant, buffet, and fast food.  When eating at a restaurant, ask that your food be prepared with less salt or no salt, if possible. What foods are recommended? The items listed may not be a complete list. Talk with your dietitian about what   dietary choices are best for you. Grains Whole-grain or whole-wheat bread. Whole-grain or whole-wheat pasta. Brown rice. Oatmeal. Quinoa. Bulgur. Whole-grain and low-sodium cereals. Pita bread. Low-fat, low-sodium crackers. Whole-wheat flour tortillas. Vegetables Fresh or frozen vegetables (raw, steamed, roasted, or grilled). Low-sodium or reduced-sodium tomato and vegetable juice. Low-sodium or reduced-sodium tomato sauce and tomato paste. Low-sodium or reduced-sodium canned vegetables. Fruits All fresh, dried, or frozen fruit. Canned fruit in natural juice (without  added sugar). Meat and other protein foods Skinless chicken or turkey. Ground chicken or turkey. Pork with fat trimmed off. Fish and seafood. Egg whites. Dried beans, peas, or lentils. Unsalted nuts, nut butters, and seeds. Unsalted canned beans. Lean cuts of beef with fat trimmed off. Low-sodium, lean deli meat. Dairy Low-fat (1%) or fat-free (skim) milk. Fat-free, low-fat, or reduced-fat cheeses. Nonfat, low-sodium ricotta or cottage cheese. Low-fat or nonfat yogurt. Low-fat, low-sodium cheese. Fats and oils Soft margarine without trans fats. Vegetable oil. Low-fat, reduced-fat, or light mayonnaise and salad dressings (reduced-sodium). Canola, safflower, olive, soybean, and sunflower oils. Avocado. Seasoning and other foods Herbs. Spices. Seasoning mixes without salt. Unsalted popcorn and pretzels. Fat-free sweets. What foods are not recommended? The items listed may not be a complete list. Talk with your dietitian about what dietary choices are best for you. Grains Baked goods made with fat, such as croissants, muffins, or some breads. Dry pasta or rice meal packs. Vegetables Creamed or fried vegetables. Vegetables in a cheese sauce. Regular canned vegetables (not low-sodium or reduced-sodium). Regular canned tomato sauce and paste (not low-sodium or reduced-sodium). Regular tomato and vegetable juice (not low-sodium or reduced-sodium). Pickles. Olives. Fruits Canned fruit in a light or heavy syrup. Fried fruit. Fruit in cream or butter sauce. Meat and other protein foods Fatty cuts of meat. Ribs. Fried meat. Bacon. Sausage. Bologna and other processed lunch meats. Salami. Fatback. Hotdogs. Bratwurst. Salted nuts and seeds. Canned beans with added salt. Canned or smoked fish. Whole eggs or egg yolks. Chicken or turkey with skin. Dairy Whole or 2% milk, cream, and half-and-half. Whole or full-fat cream cheese. Whole-fat or sweetened yogurt. Full-fat cheese. Nondairy creamers. Whipped toppings.  Processed cheese and cheese spreads. Fats and oils Butter. Stick margarine. Lard. Shortening. Ghee. Bacon fat. Tropical oils, such as coconut, palm kernel, or palm oil. Seasoning and other foods Salted popcorn and pretzels. Onion salt, garlic salt, seasoned salt, table salt, and sea salt. Worcestershire sauce. Tartar sauce. Barbecue sauce. Teriyaki sauce. Soy sauce, including reduced-sodium. Steak sauce. Canned and packaged gravies. Fish sauce. Oyster sauce. Cocktail sauce. Horseradish that you find on the shelf. Ketchup. Mustard. Meat flavorings and tenderizers. Bouillon cubes. Hot sauce and Tabasco sauce. Premade or packaged marinades. Premade or packaged taco seasonings. Relishes. Regular salad dressings. Where to find more information:  National Heart, Lung, and Blood Institute: www.nhlbi.nih.gov  American Heart Association: www.heart.org Summary  The DASH eating plan is a healthy eating plan that has been shown to reduce high blood pressure (hypertension). It may also reduce your risk for type 2 diabetes, heart disease, and stroke.  With the DASH eating plan, you should limit salt (sodium) intake to 2,300 mg a day. If you have hypertension, you may need to reduce your sodium intake to 1,500 mg a day.  When on the DASH eating plan, aim to eat more fresh fruits and vegetables, whole grains, lean proteins, low-fat dairy, and heart-healthy fats.  Work with your health care provider or diet and nutrition specialist (dietitian) to adjust your eating plan to your individual   calorie needs. This information is not intended to replace advice given to you by your health care provider. Make sure you discuss any questions you have with your health care provider. Document Released: 10/22/2011 Document Revised: 10/26/2016 Document Reviewed: 10/26/2016 Elsevier Interactive Patient Education  2017 Elsevier Inc.  

## 2017-02-20 LAB — BASIC METABOLIC PANEL
BUN/Creatinine Ratio: 10 (ref 9–20)
BUN: 11 mg/dL (ref 6–24)
CALCIUM: 9.8 mg/dL (ref 8.7–10.2)
CO2: 26 mmol/L (ref 18–29)
CREATININE: 1.12 mg/dL (ref 0.76–1.27)
Chloride: 101 mmol/L (ref 96–106)
GFR, EST AFRICAN AMERICAN: 85 mL/min/{1.73_m2} (ref >59)
GFR, EST NON AFRICAN AMERICAN: 74 mL/min/{1.73_m2} (ref >59)
Glucose: 103 mg/dL — ABNORMAL HIGH (ref 65–99)
Potassium: 4.9 mmol/L (ref 3.5–5.2)
Sodium: 143 mmol/L (ref 134–144)

## 2017-02-20 LAB — PSA: Prostate Specific Ag, Serum: 0.4 ng/mL (ref 0.0–4.0)

## 2017-02-21 ENCOUNTER — Encounter: Payer: Self-pay | Admitting: Family Medicine

## 2017-02-24 DIAGNOSIS — M25511 Pain in right shoulder: Secondary | ICD-10-CM | POA: Diagnosis not present

## 2017-03-03 ENCOUNTER — Encounter: Payer: Self-pay | Admitting: Family Medicine

## 2017-03-03 DIAGNOSIS — M25511 Pain in right shoulder: Secondary | ICD-10-CM | POA: Diagnosis not present

## 2017-03-08 ENCOUNTER — Telehealth: Payer: Self-pay | Admitting: Family Medicine

## 2017-03-08 NOTE — Telephone Encounter (Signed)
His blood pressure numbers don't look bad but many of them are still in the upper range ideally we would like to see blood pressure be more in the range of 130/80 or less if possible. I recommend increasing lisinopril new dose 10 mg daily, #30, 4 refills keep follow-up visits send [place] some readings in a few weeks time

## 2017-03-08 NOTE — Telephone Encounter (Signed)
Review blood pressure reading faxed over in results folder.

## 2017-03-09 ENCOUNTER — Other Ambulatory Visit: Payer: Self-pay | Admitting: *Deleted

## 2017-03-09 MED ORDER — LISINOPRIL 10 MG PO TABS: 10.0000 mg | ORAL_TABLET | Freq: Every day | ORAL | 0 refills | Status: DC

## 2017-03-09 NOTE — Telephone Encounter (Signed)
Discussed with pt. Pt verbalized understanding. Med sent to pharm.  

## 2017-03-09 NOTE — Telephone Encounter (Signed)
Left message to return call 

## 2017-03-10 DIAGNOSIS — M25511 Pain in right shoulder: Secondary | ICD-10-CM | POA: Diagnosis not present

## 2017-03-17 DIAGNOSIS — M25511 Pain in right shoulder: Secondary | ICD-10-CM | POA: Diagnosis not present

## 2017-04-01 ENCOUNTER — Ambulatory Visit (INDEPENDENT_AMBULATORY_CARE_PROVIDER_SITE_OTHER): Payer: BLUE CROSS/BLUE SHIELD | Admitting: Otolaryngology

## 2017-04-01 DIAGNOSIS — H903 Sensorineural hearing loss, bilateral: Secondary | ICD-10-CM | POA: Diagnosis not present

## 2017-04-06 ENCOUNTER — Ambulatory Visit (INDEPENDENT_AMBULATORY_CARE_PROVIDER_SITE_OTHER): Payer: BLUE CROSS/BLUE SHIELD | Admitting: Family Medicine

## 2017-04-06 VITALS — BP 138/86 | Ht 70.0 in | Wt 227.0 lb

## 2017-04-06 DIAGNOSIS — I10 Essential (primary) hypertension: Secondary | ICD-10-CM | POA: Diagnosis not present

## 2017-04-06 DIAGNOSIS — R490 Dysphonia: Secondary | ICD-10-CM

## 2017-04-06 MED ORDER — LISINOPRIL 10 MG PO TABS: 10.0000 mg | ORAL_TABLET | Freq: Every day | ORAL | 1 refills | Status: DC

## 2017-04-06 NOTE — Progress Notes (Signed)
   Subjective:    Patient ID: Stephen Barnes, male    DOB: 1961-03-16, 56 y.o.   MRN: [id number]  Hypertension  This is a chronic problem. The current episode started more than 1 year ago. Pertinent negatives include no chest pain, headaches or shortness of breath. Risk factors for coronary artery disease include male gender. Treatments tried: lisinopril. There are no compliance problems.     Voice seems hoarse since taking med but his blood pressure is coming down.   Review of Systems  Constitutional: Negative for activity change, fatigue and fever.  Respiratory: Negative for cough and shortness of breath.   Cardiovascular: Negative for chest pain and leg swelling.  Neurological: Negative for headaches.       Objective:   Physical Exam  Constitutional: He appears well-nourished.  Cardiovascular: Normal rate, regular rhythm and normal heart sounds.   No murmur heard. Pulmonary/Chest: Effort normal and breath sounds normal.  Musculoskeletal: He exhibits no edema.  Lymphadenopathy:    He has no cervical adenopathy.  Neurological: He is alert.  Psychiatric: His behavior is normal.  Vitals reviewed.         Assessment & Plan:  HTN- Patient was seen today as part of a visit regarding hypertension. The importance of healthy diet and regular physical activity was discussed. The importance of compliance with medications discussed. Ideal goal is to keep blood pressure low elevated levels certainly below 774/12 when possible. The patient was counseled that keeping blood pressure under control lessen his risk of heart attack, stroke, kidney failure, and early death. The importance of regular follow-ups was discussed with the patient. Low-salt diet such as DASH recommended. Regular physical activity was recommended as well. Patient was advised to keep regular follow-ups.  I do not appreciate the hoarseness that the wife speaks of the patient states he does not notice it either I encouraged  him to take allergy medicine on a regular basis if the hoarseness has not gone away over the course of the next couple weeks referral to ENT the wife will let us know. The patient is aware to let us know as well

## 2017-05-28 ENCOUNTER — Emergency Department (HOSPITAL_COMMUNITY)
Admission: EM | Admit: 2017-05-28 | Discharge: 2017-05-28 | Disposition: A | Discharge status: Home / Self Care | Payer: BLUE CROSS/BLUE SHIELD | Attending: Emergency Medicine | Admitting: Emergency Medicine

## 2017-05-28 ENCOUNTER — Emergency Department (HOSPITAL_COMMUNITY): Payer: BLUE CROSS/BLUE SHIELD

## 2017-05-28 DIAGNOSIS — Y999 Unspecified external cause status: Secondary | ICD-10-CM | POA: Diagnosis not present

## 2017-05-28 DIAGNOSIS — S6992XA Unspecified injury of left wrist, hand and finger(s), initial encounter: Secondary | ICD-10-CM | POA: Diagnosis not present

## 2017-05-28 DIAGNOSIS — S62311A Displaced fracture of base of second metacarpal bone. left hand, initial encounter for closed fracture: Secondary | ICD-10-CM | POA: Diagnosis not present

## 2017-05-28 DIAGNOSIS — Z87891 Personal history of nicotine dependence: Secondary | ICD-10-CM | POA: Diagnosis not present

## 2017-05-28 DIAGNOSIS — S62331A Displaced fracture of neck of second metacarpal bone, left hand, initial encounter for closed fracture: Secondary | ICD-10-CM | POA: Diagnosis not present

## 2017-05-28 DIAGNOSIS — Y939 Activity, unspecified: Secondary | ICD-10-CM | POA: Diagnosis not present

## 2017-05-28 DIAGNOSIS — Z23 Encounter for immunization: Secondary | ICD-10-CM | POA: Diagnosis not present

## 2017-05-28 DIAGNOSIS — Y929 Unspecified place or not applicable: Secondary | ICD-10-CM | POA: Insufficient documentation

## 2017-05-28 DIAGNOSIS — W270XXA Contact with workbench tool, initial encounter: Secondary | ICD-10-CM | POA: Insufficient documentation

## 2017-05-28 DIAGNOSIS — Z79899 Other long term (current) drug therapy: Secondary | ICD-10-CM | POA: Diagnosis not present

## 2017-05-28 MED ORDER — HYDROCODONE-ACETAMINOPHEN 5-325 MG PO TABS: 1.0000 | ORAL_TABLET | ORAL | 0 refills | Status: DC | PRN

## 2017-05-28 MED ORDER — TETANUS-DIPHTH-ACELL PERTUSSIS 5-2.5-18.5 LF-MCG/0.5 IM SUSP
0.5000 mL | Freq: Once | INTRAMUSCULAR | Status: AC
  Administered 2017-05-28: 0.5 mL via INTRAMUSCULAR
  Filled 2017-05-28: qty 0.5

## 2017-05-28 NOTE — ED Triage Notes (Signed)
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 Left hand swelling and laceration.

## 2017-05-28 NOTE — ED Provider Notes (Signed)
[facility] DEPT Provider Note   CSN: [id number] Arrival date & time: 05/28/17  1549     History   Chief Complaint Chief Complaint  Patient presents with  . Hand Injury    HPI Stephen Barnes is a 56 y.o. male.  Patient is a 56 year old male who presents to the emergency department with complaint of injury to the left hand.  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 He sustained a laceration and swelling to the left hand. The patient denies being on any anticoagulation medications. He has not had any previous operations or procedures involving the left upper extremity. No other injuries reported at this time. Patient is unsure of the date of his last tetanus.      Past Medical History:  Diagnosis Date  . Medical history non-contributory     There are no active problems to display for this patient.   Past Surgical History:  Procedure Laterality Date  . COLONOSCOPY N/A 04/01/2016   Procedure: COLONOSCOPY;  Surgeon: Rogene Houston, MD;  Location: [facility];  Service: Endoscopy;  Laterality: N/A;  830  . NO PAST SURGERIES         Home Medications    Prior to Admission medications   Medication Sig Start Date End Date Taking? Authorizing Provider  HYDROcodone-acetaminophen (NORCO/VICODIN) 5-325 MG tablet Take 1 tablet by mouth every 4 (four) hours as needed. 05/28/17   Lily Kocher, PA-C  lisinopril (PRINIVIL,ZESTRIL) 10 MG tablet Take 1 tablet (10 mg total) by mouth daily. 04/06/17   Kathyrn Drown, MD    Family History No family history on file.  Social History Social History  Substance Use Topics  . Smoking status: Former Research scientist (life sciences)  . Smokeless tobacco: Never Used  . Alcohol use No     Allergies   Patient has no known allergies.   Review of Systems Review of Systems  Constitutional: Negative for activity change.       All ROS Neg except as noted in HPI    HENT: Negative for nosebleeds.   Eyes: Negative for photophobia and discharge.  Respiratory: Negative for cough, shortness of breath and wheezing.   Cardiovascular: Negative for chest pain and palpitations.  Gastrointestinal: Negative for abdominal pain and blood in stool.  Genitourinary: Negative for dysuria, frequency and hematuria.  Musculoskeletal: Positive for arthralgias. Negative for back pain and neck pain.  Skin: Negative.   Neurological: Negative for dizziness, seizures and speech difficulty.  Psychiatric/Behavioral: Negative for confusion and hallucinations.     Physical Exam Updated Vital Signs BP (!) 154/85 (BP Location: Right Arm)   Pulse (!) 58   Temp 98.2 F (36.8 C) (Oral)   Resp 20   Ht 5\' 11"  (1.803 m)   Wt 101.2 kg (223 lb)   SpO2 99%   BMI 31.10 kg/m   Physical Exam  Constitutional: He is oriented to person, place, and time. He appears well-developed and well-nourished.  Non-toxic appearance.  HENT:  Head: Normocephalic.  Right Ear: Tympanic membrane and external ear normal.  Left Ear: Tympanic membrane and external ear normal.  Eyes: Pupils are equal, round, and reactive to light. EOM and lids are normal.  Neck: Normal range of motion. Neck supple. Carotid bruit is not present.  Cardiovascular: Normal rate, regular rhythm, normal heart sounds, intact distal pulses and normal pulses.   Pulmonary/Chest: Breath sounds normal. No respiratory distress.  Abdominal: Soft. Bowel sounds are normal. There is no tenderness. There is no guarding.  Musculoskeletal:       Left hand: He exhibits decreased range of motion, tenderness, laceration and swelling. He exhibits normal capillary refill. Normal sensation noted. Normal strength noted.       Hands: Shallow laceration to the dorsum of the left hand. This laceration is not a candidate for suture repair.  Lymphadenopathy:       Head (right side): No submandibular adenopathy present.       Head (left side): No  submandibular adenopathy present.    He has no cervical adenopathy.  Neurological: He is alert and oriented to person, place, and time. He has normal strength. No cranial nerve deficit or sensory deficit.  Skin: Skin is warm and dry.  Psychiatric: He has a normal mood and affect. His speech is normal.  Nursing note and vitals reviewed.    ED Treatments / Results  Labs (all labs ordered are listed, but only abnormal results are displayed) Labs Reviewed - No data to display  EKG  EKG Interpretation None       Radiology Dg Hand Complete Left  Result Date: 05/28/2017 CLINICAL DATA:  Former, a jack came down on LEFT hand about 1530 hours, LEFT hand swelling and laceration, injury, initial encounter EXAM: LEFT HAND - COMPLETE 3+ VIEW COMPARISON:  None FINDINGS: Osseous mineralization normal. Joint spaces preserved. Displaced comminuted fracture of the head/neck of the second metacarpal. No additional fracture, dislocation, or bone destruction. Mild soft tissue swelling at dorsum of the hand overlying the fracture. IMPRESSION: Mildly displaced comminuted fracture of the head/neck of the LEFT second metacarpal. Electronically Signed   By: Lavonia Dana M.D.   On: 05/28/2017 17:23    Procedures Procedures (including critical care time) FRACTURE CARE. Posterior splint Patient had a heavy jack to come down on his left hand. He sustained a comminuted fracture of the head of the left second metacarpal.  I discussed the fracture with the patient in terms which he understood. I also discussed the procedure of splinting and immobilization. The patient gives permission for the procedure.  Patient identified by arm band. Procedural time out taken.  There no sensory deficits no temperature changes and no vascular deficits appreciated prior to applying the splint on. The area was wrapped with web cotton. A posterior splint was fashioned with the orthoglass. 2 Ace wrap was then used to stabilize the  splint and fracture area. Following the procedure, the patient has excellent capillary refill there no temperature changes appreciated. There no changes in sensation of the exposed area. The patient was provided with an ice pack. Prescription given for pain medication. The patient tolerated the procedure without problem. Medications Ordered in ED Medications  Tdap (BOOSTRIX) injection 0.5 mL (not administered)     Initial Impression / Assessment and Plan / ED Course  I have reviewed the triage vital signs and the nursing notes.  Pertinent labs & imaging results that were available during my care of the patient were reviewed by me and considered in my medical decision making (see chart for details).       Final Clinical Impressions(s) / ED Diagnoses MDM Patient sustained injury to the left hand. Patient was noted to have a comminuted fracture of the head of the left second metacarpal. Patient had a shallow laceration that did not require sutures. This was updated. Patient was placed in a posterior splint. Prescription for pain medications been given. Patient is been  referred to the hand specialist. Patient is in agreement with this plan. He will return to the emergency department if any changes, problems, or concerns.    Final diagnoses:  Closed displaced fracture of neck of second metacarpal bone of left hand, initial encounter    New Prescriptions New Prescriptions   HYDROCODONE-ACETAMINOPHEN (NORCO/VICODIN) 5-325 MG TABLET    Take 1 tablet by mouth every 4 (four) hours as needed.     Lily Kocher, PA-C 06/07/17 [place], [place], DO 06/08/17 2313

## 2017-05-28 NOTE — Discharge Instructions (Signed)
You have a displaced fracture of one of the bones in your left hand. Please leave the splint in place until you're seen by Dr. Aline Brochure. Please elevate your hand is much as possible. Use Tylenol or ibuprofen for mild pain, use Norco for more severe pain. Norco may cause drowsiness. Please do not drive a vehicle, operating machinery, or participate in activities requiring concentration when taking this medication.

## 2017-05-31 ENCOUNTER — Telehealth: Payer: Self-pay | Admitting: Orthopedic Surgery

## 2017-05-31 NOTE — Telephone Encounter (Signed)
Patient called today regarding Emergency room visit on Friday, 05/28/17, for problem of "displaced" fracture of hand. Patient also put spouse on line, and gave permission for me to relay to both -- patient nquired about an appointment with Dr Aline Brochure, as Forestine Na placed this provider name on his paperwork.  Relayed Dr Aline Brochure has been out of office and not on call; therefore, per review of call schedule, Dr Geralynn Rile of [place] was on call.  Patient will contact this office for appointment.

## 2017-06-01 DIAGNOSIS — S62331A Displaced fracture of neck of second metacarpal bone, left hand, initial encounter for closed fracture: Secondary | ICD-10-CM | POA: Diagnosis not present

## 2017-06-03 DIAGNOSIS — S66391A Other injury of extensor muscle, fascia and tendon of left index finger at wrist and hand level, initial encounter: Secondary | ICD-10-CM | POA: Diagnosis not present

## 2017-06-03 DIAGNOSIS — S62611A Displaced fracture of proximal phalanx of left index finger, initial encounter for closed fracture: Secondary | ICD-10-CM | POA: Diagnosis not present

## 2017-06-03 DIAGNOSIS — Y999 Unspecified external cause status: Secondary | ICD-10-CM | POA: Diagnosis not present

## 2017-06-03 DIAGNOSIS — S66311A Strain of extensor muscle, fascia and tendon of left index finger at wrist and hand level, initial encounter: Secondary | ICD-10-CM | POA: Diagnosis not present

## 2017-06-03 DIAGNOSIS — S62391A Other fracture of second metacarpal bone, left hand, initial encounter for closed fracture: Secondary | ICD-10-CM | POA: Diagnosis not present

## 2017-06-15 DIAGNOSIS — M79642 Pain in left hand: Secondary | ICD-10-CM | POA: Diagnosis not present

## 2017-06-15 DIAGNOSIS — S62331D Displaced fracture of neck of second metacarpal bone, left hand, subsequent encounter for fracture with routine healing: Secondary | ICD-10-CM | POA: Diagnosis not present

## 2017-06-22 DIAGNOSIS — M79642 Pain in left hand: Secondary | ICD-10-CM | POA: Diagnosis not present

## 2017-06-30 DIAGNOSIS — M79642 Pain in left hand: Secondary | ICD-10-CM | POA: Diagnosis not present

## 2017-07-05 DIAGNOSIS — M79642 Pain in left hand: Secondary | ICD-10-CM | POA: Diagnosis not present

## 2017-07-05 DIAGNOSIS — S62331D Displaced fracture of neck of second metacarpal bone, left hand, subsequent encounter for fracture with routine healing: Secondary | ICD-10-CM | POA: Diagnosis not present

## 2017-07-12 DIAGNOSIS — M79642 Pain in left hand: Secondary | ICD-10-CM | POA: Diagnosis not present

## 2017-07-21 DIAGNOSIS — M79642 Pain in left hand: Secondary | ICD-10-CM | POA: Diagnosis not present

## 2017-07-28 DIAGNOSIS — M79642 Pain in left hand: Secondary | ICD-10-CM | POA: Diagnosis not present

## 2017-08-02 DIAGNOSIS — S62331D Displaced fracture of neck of second metacarpal bone, left hand, subsequent encounter for fracture with routine healing: Secondary | ICD-10-CM | POA: Diagnosis not present

## 2017-08-09 DIAGNOSIS — M79642 Pain in left hand: Secondary | ICD-10-CM | POA: Diagnosis not present

## 2017-08-16 DIAGNOSIS — M79642 Pain in left hand: Secondary | ICD-10-CM | POA: Diagnosis not present

## 2017-08-25 DIAGNOSIS — M79642 Pain in left hand: Secondary | ICD-10-CM | POA: Diagnosis not present

## 2017-09-06 DIAGNOSIS — S62331D Displaced fracture of neck of second metacarpal bone, left hand, subsequent encounter for fracture with routine healing: Secondary | ICD-10-CM | POA: Diagnosis not present

## 2017-09-06 DIAGNOSIS — M79642 Pain in left hand: Secondary | ICD-10-CM | POA: Diagnosis not present

## 2017-09-13 DIAGNOSIS — M79642 Pain in left hand: Secondary | ICD-10-CM | POA: Diagnosis not present

## 2017-09-20 DIAGNOSIS — M79642 Pain in left hand: Secondary | ICD-10-CM | POA: Diagnosis not present

## 2017-09-21 ENCOUNTER — Telehealth: Payer: Self-pay | Admitting: Family Medicine

## 2017-09-21 DIAGNOSIS — R739 Hyperglycemia, unspecified: Secondary | ICD-10-CM

## 2017-09-21 DIAGNOSIS — E785 Hyperlipidemia, unspecified: Secondary | ICD-10-CM

## 2017-09-21 DIAGNOSIS — I10 Essential (primary) hypertension: Secondary | ICD-10-CM

## 2017-09-21 NOTE — Telephone Encounter (Signed)
Metabolic 7, X2V, lipid liver, hyperlipidemia, fasting hyperglycemia, HTN

## 2017-09-21 NOTE — Telephone Encounter (Signed)
Pt is needing lab orders sent over for an upcoming appt last labs per epic were: psa,bmp on 02/19/17

## 2017-09-21 NOTE — Telephone Encounter (Signed)
Please advise 

## 2017-09-22 NOTE — Telephone Encounter (Signed)
Spoke with patient and informed him per Dr.Scott Luking- Labs have been ordered. Patient verbalized understanding.  

## 2017-09-24 DIAGNOSIS — I10 Essential (primary) hypertension: Secondary | ICD-10-CM | POA: Diagnosis not present

## 2017-09-24 DIAGNOSIS — R739 Hyperglycemia, unspecified: Secondary | ICD-10-CM | POA: Diagnosis not present

## 2017-09-24 DIAGNOSIS — E785 Hyperlipidemia, unspecified: Secondary | ICD-10-CM | POA: Diagnosis not present

## 2017-09-25 ENCOUNTER — Encounter: Payer: Self-pay | Admitting: Family Medicine

## 2017-09-25 LAB — BASIC METABOLIC PANEL
BUN / CREAT RATIO: 15 (ref 9–20)
BUN: 17 mg/dL (ref 6–24)
CO2: 23 mmol/L (ref 20–29)
CREATININE: 1.17 mg/dL (ref 0.76–1.27)
Calcium: 9.6 mg/dL (ref 8.7–10.2)
Chloride: 104 mmol/L (ref 96–106)
GFR calc Af Amer: 81 mL/min/{1.73_m2} (ref >59)
GFR calc non Af Amer: 70 mL/min/{1.73_m2} (ref >59)
Glucose: 94 mg/dL (ref 65–99)
Potassium: 4.8 mmol/L (ref 3.5–5.2)
Sodium: 141 mmol/L (ref 134–144)

## 2017-09-25 LAB — HEPATIC FUNCTION PANEL
ALT: 33 IU/L (ref 0–44)
AST: 24 IU/L (ref 0–40)
Albumin: 4.6 g/dL (ref 3.5–5.5)
Alkaline Phosphatase: 76 IU/L (ref 39–117)
BILIRUBIN, DIRECT: 0.13 mg/dL (ref 0.00–0.40)
Bilirubin Total: 0.5 mg/dL (ref 0.0–1.2)
Total Protein: 6.9 g/dL (ref 6.0–8.5)

## 2017-09-25 LAB — LIPID PANEL
CHOL/HDL RATIO: 4.6 ratio (ref 0.0–5.0)
CHOLESTEROL TOTAL: 189 mg/dL (ref 100–199)
HDL: 41 mg/dL (ref >39)
LDL Calculated: 129 mg/dL — ABNORMAL HIGH (ref 0–99)
TRIGLYCERIDES: 96 mg/dL (ref 0–149)
VLDL Cholesterol Cal: 19 mg/dL (ref 5–40)

## 2017-09-25 LAB — HEMOGLOBIN A1C
Est. average glucose Bld gHb Est-mCnc: 111 mg/dL
HEMOGLOBIN A1C: 5.5 % (ref 4.8–5.6)

## 2017-09-27 DIAGNOSIS — M79642 Pain in left hand: Secondary | ICD-10-CM | POA: Diagnosis not present

## 2017-10-04 DIAGNOSIS — M79642 Pain in left hand: Secondary | ICD-10-CM | POA: Diagnosis not present

## 2017-10-11 DIAGNOSIS — M79642 Pain in left hand: Secondary | ICD-10-CM | POA: Diagnosis not present

## 2017-10-11 DIAGNOSIS — S62331D Displaced fracture of neck of second metacarpal bone, left hand, subsequent encounter for fracture with routine healing: Secondary | ICD-10-CM | POA: Diagnosis not present

## 2017-10-12 ENCOUNTER — Encounter: Payer: Self-pay | Admitting: Family Medicine

## 2017-10-12 ENCOUNTER — Ambulatory Visit (INDEPENDENT_AMBULATORY_CARE_PROVIDER_SITE_OTHER): Payer: BLUE CROSS/BLUE SHIELD | Admitting: Family Medicine

## 2017-10-12 VITALS — BP 138/86 | Ht 70.0 in | Wt 227.0 lb

## 2017-10-12 DIAGNOSIS — I10 Essential (primary) hypertension: Secondary | ICD-10-CM

## 2017-10-12 MED ORDER — LISINOPRIL 10 MG PO TABS: 10.0000 mg | ORAL_TABLET | Freq: Every day | ORAL | 1 refills | Status: DC

## 2017-10-12 NOTE — Patient Instructions (Signed)
DASH Eating Plan DASH stands for "Dietary Approaches to Stop Hypertension." The DASH eating plan is a healthy eating plan that has been shown to reduce high blood pressure (hypertension). It may also reduce your risk for type 2 diabetes, heart disease, and stroke. The DASH eating plan may also help with weight loss. What are tips for following this plan? General guidelines  Avoid eating more than 2,300 mg (milligrams) of salt (sodium) a day. If you have hypertension, you may need to reduce your sodium intake to 1,500 mg a day.  Limit alcohol intake to no more than 1 drink a day for nonpregnant women and 2 drinks a day for men. One drink equals 12 oz of beer, 5 oz of wine, or 1 oz of hard liquor.  Work with your health care provider to maintain a healthy body weight or to lose weight. Ask what an ideal weight is for you.  Get at least 30 minutes of exercise that causes your heart to beat faster (aerobic exercise) most days of the week. Activities may include walking, swimming, or biking.  Work with your health care provider or diet and nutrition specialist (dietitian) to adjust your eating plan to your individual calorie needs. Reading food labels  Check food labels for the amount of sodium per serving. Choose foods with less than 5 percent of the Daily Value of sodium. Generally, foods with less than 300 mg of sodium per serving fit into this eating plan.  To find whole grains, look for the word "whole" as the first word in the ingredient list. Shopping  Buy products labeled as "low-sodium" or "no salt added."  Buy fresh foods. Avoid canned foods and premade or frozen meals. Cooking  Avoid adding salt when cooking. Use salt-free seasonings or herbs instead of table salt or sea salt. Check with your health care provider or pharmacist before using salt substitutes.  Do not fry foods. Cook foods using healthy methods such as baking, boiling, grilling, and broiling instead.  Cook with  heart-healthy oils, such as olive, canola, soybean, or sunflower oil. Meal planning   Eat a balanced diet that includes: ? 5 or more servings of fruits and vegetables each day. At each meal, try to fill half of your plate with fruits and vegetables. ? Up to 6-8 servings of whole grains each day. ? Less than 6 oz of lean meat, poultry, or fish each day. A 3-oz serving of meat is about the same size as a deck of cards. One egg equals 1 oz. ? 2 servings of low-fat dairy each day. ? A serving of nuts, seeds, or beans 5 times each week. ? Heart-healthy fats. Healthy fats called Omega-3 fatty acids are found in foods such as flaxseeds and coldwater fish, like sardines, salmon, and mackerel.  Limit how much you eat of the following: ? Canned or prepackaged foods. ? Food that is high in trans fat, such as fried foods. ? Food that is high in saturated fat, such as fatty meat. ? Sweets, desserts, sugary drinks, and other foods with added sugar. ? Full-fat dairy products.  Do not salt foods before eating.  Try to eat at least 2 vegetarian meals each week.  Eat more home-cooked food and less restaurant, buffet, and fast food.  When eating at a restaurant, ask that your food be prepared with less salt or no salt, if possible. What foods are recommended? The items listed may not be a complete list. Talk with your dietitian about what   dietary choices are best for you. Grains Whole-grain or whole-wheat bread. Whole-grain or whole-wheat pasta. Brown rice. Oatmeal. Quinoa. Bulgur. Whole-grain and low-sodium cereals. Pita bread. Low-fat, low-sodium crackers. Whole-wheat flour tortillas. Vegetables Fresh or frozen vegetables (raw, steamed, roasted, or grilled). Low-sodium or reduced-sodium tomato and vegetable juice. Low-sodium or reduced-sodium tomato sauce and tomato paste. Low-sodium or reduced-sodium canned vegetables. Fruits All fresh, dried, or frozen fruit. Canned fruit in natural juice (without  added sugar). Meat and other protein foods Skinless chicken or turkey. Ground chicken or turkey. Pork with fat trimmed off. Fish and seafood. Egg whites. Dried beans, peas, or lentils. Unsalted nuts, nut butters, and seeds. Unsalted canned beans. Lean cuts of beef with fat trimmed off. Low-sodium, lean deli meat. Dairy Low-fat (1%) or fat-free (skim) milk. Fat-free, low-fat, or reduced-fat cheeses. Nonfat, low-sodium ricotta or cottage cheese. Low-fat or nonfat yogurt. Low-fat, low-sodium cheese. Fats and oils Soft margarine without trans fats. Vegetable oil. Low-fat, reduced-fat, or light mayonnaise and salad dressings (reduced-sodium). Canola, safflower, olive, soybean, and sunflower oils. Avocado. Seasoning and other foods Herbs. Spices. Seasoning mixes without salt. Unsalted popcorn and pretzels. Fat-free sweets. What foods are not recommended? The items listed may not be a complete list. Talk with your dietitian about what dietary choices are best for you. Grains Baked goods made with fat, such as croissants, muffins, or some breads. Dry pasta or rice meal packs. Vegetables Creamed or fried vegetables. Vegetables in a cheese sauce. Regular canned vegetables (not low-sodium or reduced-sodium). Regular canned tomato sauce and paste (not low-sodium or reduced-sodium). Regular tomato and vegetable juice (not low-sodium or reduced-sodium). Pickles. Olives. Fruits Canned fruit in a light or heavy syrup. Fried fruit. Fruit in cream or butter sauce. Meat and other protein foods Fatty cuts of meat. Ribs. Fried meat. Bacon. Sausage. Bologna and other processed lunch meats. Salami. Fatback. Hotdogs. Bratwurst. Salted nuts and seeds. Canned beans with added salt. Canned or smoked fish. Whole eggs or egg yolks. Chicken or turkey with skin. Dairy Whole or 2% milk, cream, and half-and-half. Whole or full-fat cream cheese. Whole-fat or sweetened yogurt. Full-fat cheese. Nondairy creamers. Whipped toppings.  Processed cheese and cheese spreads. Fats and oils Butter. Stick margarine. Lard. Shortening. Ghee. Bacon fat. Tropical oils, such as coconut, palm kernel, or palm oil. Seasoning and other foods Salted popcorn and pretzels. Onion salt, garlic salt, seasoned salt, table salt, and sea salt. Worcestershire sauce. Tartar sauce. Barbecue sauce. Teriyaki sauce. Soy sauce, including reduced-sodium. Steak sauce. Canned and packaged gravies. Fish sauce. Oyster sauce. Cocktail sauce. Horseradish that you find on the shelf. Ketchup. Mustard. Meat flavorings and tenderizers. Bouillon cubes. Hot sauce and Tabasco sauce. Premade or packaged marinades. Premade or packaged taco seasonings. Relishes. Regular salad dressings. Where to find more information:  National Heart, Lung, and Blood Institute: www.nhlbi.nih.gov  American Heart Association: www.heart.org Summary  The DASH eating plan is a healthy eating plan that has been shown to reduce high blood pressure (hypertension). It may also reduce your risk for type 2 diabetes, heart disease, and stroke.  With the DASH eating plan, you should limit salt (sodium) intake to 2,300 mg a day. If you have hypertension, you may need to reduce your sodium intake to 1,500 mg a day.  When on the DASH eating plan, aim to eat more fresh fruits and vegetables, whole grains, lean proteins, low-fat dairy, and heart-healthy fats.  Work with your health care provider or diet and nutrition specialist (dietitian) to adjust your eating plan to your individual   calorie needs. This information is not intended to replace advice given to you by your health care provider. Make sure you discuss any questions you have with your health care provider. Document Released: 10/22/2011 Document Revised: 10/26/2016 Document Reviewed: 10/26/2016 Elsevier Interactive Patient Education  2017 Elsevier Inc.  

## 2017-10-12 NOTE — Progress Notes (Signed)
   Subjective:    Patient ID: Stephen Barnes, male    DOB: 21-May-1961, 56 y.o.   MRN: [id number]  Hypertension  This is a chronic problem. The current episode started more than 1 year ago. Pertinent negatives include no chest pain, headaches or shortness of breath. Risk factors for coronary artery disease include male gender. Treatments tried: lisinopril. There are no compliance problems.    Patient taking blood pressure medicine.  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  Respiratory: Negative for cough and shortness of breath.   Cardiovascular: Negative for chest pain and leg swelling.  Neurological: Negative for headaches.       Objective:   Physical Exam  Constitutional: He appears well-nourished. No distress.  Cardiovascular: Normal rate, regular rhythm and normal heart sounds.  No murmur heard. Pulmonary/Chest: Effort normal and breath sounds normal. No respiratory distress.  Musculoskeletal: He exhibits no edema.  Lymphadenopathy:    He has no cervical adenopathy.  Neurological: He is alert.  Psychiatric: His behavior is normal.  Vitals reviewed.  Patient under a fair amount of stress not depressed we did discuss how to handle stress effectively  Patient defers on flu vaccine Patient quit smoking in 2007 Lab work was reviewed with the patient in detail Cholesterol mildly elevated patient encouraged to watch diet we will recheck this again in the spring Patient having some mild stress issues we discussed how to handle those as best as possible    Assessment & Plan:  HTN- Patient was seen today as part of a visit regarding hypertension. The importance of healthy diet and regular physical activity was discussed. The  importance of compliance with medications discussed. Ideal goal is to keep blood pressure low elevated levels certainly below 967/89 when possible. The patient was counseled that keeping blood pressure under control lessen his risk of heart attack, stroke, kidney failure, and early death. The importance of regular follow-ups was discussed with the patient. Low-salt diet such as DASH recommended. Regular physical activity was recommended as well. Patient was advised to keep regular follow-ups.  Patient was counseled regarding low-salt diet was also counseled to fit and exercising on a regular basis he will gather some readings and send him to [place] in  Patient was encouraged to try to lose some weight.  We will do lab work and follow-up office visit in the spring may need to adjust blood pressure medicines based upon the results of his readings that he sends [place]  Patient will follow-up in approximately 6 months time

## 2017-12-02 DIAGNOSIS — S62351D Nondisplaced fracture of shaft of second metacarpal bone, left hand, subsequent encounter for fracture with routine healing: Secondary | ICD-10-CM | POA: Diagnosis not present

## 2017-12-02 DIAGNOSIS — S60222A Contusion of left hand, initial encounter: Secondary | ICD-10-CM | POA: Diagnosis not present

## 2017-12-06 DIAGNOSIS — C44519 Basal cell carcinoma of skin of other part of trunk: Secondary | ICD-10-CM | POA: Diagnosis not present

## 2017-12-06 DIAGNOSIS — X32XXXA Exposure to sunlight, initial encounter: Secondary | ICD-10-CM | POA: Diagnosis not present

## 2017-12-06 DIAGNOSIS — Z1283 Encounter for screening for malignant neoplasm of skin: Secondary | ICD-10-CM | POA: Diagnosis not present

## 2017-12-06 DIAGNOSIS — L570 Actinic keratosis: Secondary | ICD-10-CM | POA: Diagnosis not present

## 2017-12-06 DIAGNOSIS — D225 Melanocytic nevi of trunk: Secondary | ICD-10-CM | POA: Diagnosis not present

## 2018-01-28 ENCOUNTER — Ambulatory Visit (HOSPITAL_COMMUNITY)
Admission: RE | Admit: 2018-01-28 | Discharge: 2018-01-28 | Disposition: A | Discharge status: Home / Self Care | Payer: BLUE CROSS/BLUE SHIELD | Source: Ambulatory Visit | Attending: Family Medicine | Admitting: Family Medicine

## 2018-01-28 ENCOUNTER — Ambulatory Visit: Payer: BLUE CROSS/BLUE SHIELD | Admitting: Family Medicine

## 2018-01-28 ENCOUNTER — Encounter: Payer: Self-pay | Admitting: Family Medicine

## 2018-01-28 ENCOUNTER — Other Ambulatory Visit: Payer: Self-pay | Admitting: *Deleted

## 2018-01-28 VITALS — BP 132/86 | Ht 70.0 in | Wt 232.0 lb

## 2018-01-28 DIAGNOSIS — M769 Unspecified enthesopathy, lower limb, excluding foot: Secondary | ICD-10-CM | POA: Insufficient documentation

## 2018-01-28 DIAGNOSIS — M25562 Pain in left knee: Secondary | ICD-10-CM

## 2018-01-28 MED ORDER — DICLOFENAC SODIUM 75 MG PO TBEC: 75.0000 mg | DELAYED_RELEASE_TABLET | Freq: Two times a day (BID) | ORAL | 1 refills | Status: DC

## 2018-01-28 NOTE — Progress Notes (Signed)
   Subjective:    Patient ID: Stephen Barnes, male    DOB: 01-Sep-1961, 57 y.o.   MRN: [id number]  HPI  Patient arrives with left knee pain for 3 weeks. He states at times when he squats when he stands up he has jiggle his leg in order to get the knee back into the proper motion he also states at times it feels like it wants to give way he denies any type of fever chills sweats.  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  Pain is mainly on the lateral aspects.  No other particular troubles. Review of Systems  Constitutional: Negative for activity change, fatigue and fever.  HENT: Negative for congestion and rhinorrhea.   Respiratory: Negative for cough and shortness of breath.   Cardiovascular: Negative for chest pain and leg swelling.  Gastrointestinal: Negative for abdominal pain, diarrhea and nausea.  Genitourinary: Negative for dysuria and hematuria.  Musculoskeletal: Positive for arthralgias.  Neurological: Negative for weakness and headaches.  Psychiatric/Behavioral: Negative for behavioral problems.       Objective:   Physical Exam  Constitutional: He appears well-nourished.  Cardiovascular: Normal rate, regular rhythm and normal heart sounds.  No murmur heard. Pulmonary/Chest: Effort normal and breath sounds normal.  Musculoskeletal: He exhibits no edema.  Lymphadenopathy:    He has no cervical adenopathy.  Neurological: He is alert.  Psychiatric: His behavior is normal.  Vitals reviewed.  The main ligaments of the knee are stable.  There is tenderness on the lateral aspect.  No click is felt with the meniscus.   I do not recommend MRI at this point he may use a brace if he would like to hopefully anti-inflammatories will help there is no swelling in the joint x-rays are pending    Assessment & Plan:  Left knee pain X-rays Anti-inflammatories If ongoing troubles  referral to orthopedics.

## 2018-02-02 ENCOUNTER — Telehealth: Payer: Self-pay | Admitting: Family Medicine

## 2018-02-02 DIAGNOSIS — M25562 Pain in left knee: Secondary | ICD-10-CM

## 2018-02-02 NOTE — Telephone Encounter (Signed)
Patient is needing referral to orthopedic due to his knee.  He seen Dr. Nicki Reaper on 01/28/18 for this.  He can't walk on it currently.

## 2018-02-03 ENCOUNTER — Encounter: Payer: Self-pay | Admitting: Family Medicine

## 2018-02-03 DIAGNOSIS — M25561 Pain in right knee: Secondary | ICD-10-CM | POA: Diagnosis not present

## 2018-02-03 DIAGNOSIS — M1711 Unilateral primary osteoarthritis, right knee: Secondary | ICD-10-CM | POA: Diagnosis not present

## 2018-02-03 NOTE — Telephone Encounter (Signed)
Referral ordered in Epic. 

## 2018-02-03 NOTE — Telephone Encounter (Signed)
Please go ahead with referral for consultation with orthopedics.  Please see if we can get him in with Dr. Aline Brochure rather quickly-urgent referral

## 2018-02-11 DIAGNOSIS — M25562 Pain in left knee: Secondary | ICD-10-CM | POA: Diagnosis not present

## 2018-02-17 DIAGNOSIS — M25562 Pain in left knee: Secondary | ICD-10-CM | POA: Diagnosis not present

## 2018-02-22 ENCOUNTER — Encounter: Payer: Self-pay | Admitting: Family Medicine

## 2018-02-22 ENCOUNTER — Ambulatory Visit: Payer: BLUE CROSS/BLUE SHIELD | Admitting: Family Medicine

## 2018-02-22 VITALS — BP 122/78 | Ht 70.0 in | Wt 231.4 lb

## 2018-02-22 DIAGNOSIS — I10 Essential (primary) hypertension: Secondary | ICD-10-CM | POA: Diagnosis not present

## 2018-02-22 DIAGNOSIS — M25562 Pain in left knee: Secondary | ICD-10-CM | POA: Diagnosis not present

## 2018-02-22 NOTE — Progress Notes (Signed)
   Subjective:    Patient ID: Stephen Barnes, male    DOB: 07/04/1961, 57 y.o.   MRN: [id number]  HPI  Patient arrives for medical clearance for knee arthroscopy. Patient stated his knee is doing so much better now. 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  HENT: Negative for congestion and rhinorrhea.   Respiratory: Negative for cough and shortness of breath.   Cardiovascular: Negative for chest pain and leg swelling.  Gastrointestinal: Negative for abdominal pain, diarrhea and nausea.  Genitourinary: Negative for dysuria and hematuria.  Neurological: Negative for weakness and headaches.  Psychiatric/Behavioral: Negative for behavioral problems.       Objective:   Physical Exam  Constitutional: He appears well-nourished. No distress.  HENT:  Head: Normocephalic and atraumatic.  Eyes: Right eye exhibits no discharge. Left eye exhibits no discharge.  Neck: No tracheal deviation present.  Cardiovascular: Normal rate, regular rhythm and normal heart sounds.  No murmur heard. Pulmonary/Chest: Effort normal and breath sounds normal. No respiratory distress. He has no wheezes.  Musculoskeletal: He exhibits no edema.  Lymphadenopathy:    He has no cervical adenopathy.  Neurological: He is alert.  Skin: Skin is warm. No rash noted.  Psychiatric: His behavior is normal.  Vitals reviewed.         Assessment & Plan:  Blood pressure good control Watch diet closely Regular physical activity Continue  medication  Left knee cartilage issues patient is approved for surgery no NSAIDs within 7 days of surgery follow-up if progressive troubles patient needs to talk to his surgeon to make sure he understands all risk and benefits.

## 2018-02-24 ENCOUNTER — Other Ambulatory Visit: Payer: Self-pay | Admitting: Family Medicine

## 2018-02-24 ENCOUNTER — Telehealth: Payer: Self-pay | Admitting: Family Medicine

## 2018-02-24 MED ORDER — MUPIROCIN 2 % EX OINT: TOPICAL_OINTMENT | CUTANEOUS | 2 refills | Status: DC
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
## 2018-02-24 NOTE — Telephone Encounter (Signed)
Please tell the patient niacin and Bactroban ointment apply thin amount once daily wash with soap and water before hand

## 2018-02-24 NOTE — Telephone Encounter (Signed)
Bactroban ointment was sent in-niacin was a dragon error

## 2018-02-24 NOTE — Telephone Encounter (Signed)
Med was sent by dr Nicki Reaper. Left message for pt to return call.

## 2018-02-25 NOTE — Telephone Encounter (Signed)
Spoke with pts wife; informed her of med that was sent in

## 2018-03-08 DIAGNOSIS — S83242A Other tear of medial meniscus, current injury, left knee, initial encounter: Secondary | ICD-10-CM | POA: Diagnosis not present

## 2018-03-28 DIAGNOSIS — Y999 Unspecified external cause status: Secondary | ICD-10-CM | POA: Diagnosis not present

## 2018-03-28 DIAGNOSIS — G8918 Other acute postprocedural pain: Secondary | ICD-10-CM | POA: Diagnosis not present

## 2018-03-28 DIAGNOSIS — M659 Synovitis and tenosynovitis, unspecified: Secondary | ICD-10-CM | POA: Diagnosis not present

## 2018-03-28 DIAGNOSIS — M1712 Unilateral primary osteoarthritis, left knee: Secondary | ICD-10-CM | POA: Diagnosis not present

## 2018-03-28 DIAGNOSIS — M65862 Other synovitis and tenosynovitis, left lower leg: Secondary | ICD-10-CM | POA: Diagnosis not present

## 2018-03-28 DIAGNOSIS — M948X6 Other specified disorders of cartilage, lower leg: Secondary | ICD-10-CM | POA: Diagnosis not present

## 2018-03-28 DIAGNOSIS — X58XXXA Exposure to other specified factors, initial encounter: Secondary | ICD-10-CM | POA: Diagnosis not present

## 2018-03-28 DIAGNOSIS — S83242A Other tear of medial meniscus, current injury, left knee, initial encounter: Secondary | ICD-10-CM | POA: Diagnosis not present

## 2018-04-07 ENCOUNTER — Ambulatory Visit (INDEPENDENT_AMBULATORY_CARE_PROVIDER_SITE_OTHER): Payer: BLUE CROSS/BLUE SHIELD | Admitting: Otolaryngology

## 2018-04-19 ENCOUNTER — Other Ambulatory Visit: Payer: Self-pay | Admitting: Family Medicine

## 2018-05-05 DIAGNOSIS — M179 Osteoarthritis of knee, unspecified: Secondary | ICD-10-CM | POA: Insufficient documentation

## 2018-05-05 DIAGNOSIS — M1710 Unilateral primary osteoarthritis, unspecified knee: Secondary | ICD-10-CM | POA: Insufficient documentation

## 2018-05-05 DIAGNOSIS — M1712 Unilateral primary osteoarthritis, left knee: Secondary | ICD-10-CM | POA: Diagnosis not present

## 2018-07-12 DIAGNOSIS — M1732 Unilateral post-traumatic osteoarthritis, left knee: Secondary | ICD-10-CM | POA: Diagnosis not present

## 2018-07-17 ENCOUNTER — Other Ambulatory Visit: Payer: Self-pay | Admitting: Family Medicine

## 2018-07-19 DIAGNOSIS — M1732 Unilateral post-traumatic osteoarthritis, left knee: Secondary | ICD-10-CM | POA: Diagnosis not present

## 2018-07-26 DIAGNOSIS — M1732 Unilateral post-traumatic osteoarthritis, left knee: Secondary | ICD-10-CM | POA: Diagnosis not present

## 2018-10-19 ENCOUNTER — Other Ambulatory Visit: Payer: Self-pay | Admitting: *Deleted

## 2018-10-19 MED ORDER — LISINOPRIL 10 MG PO TABS: 10.0000 mg | ORAL_TABLET | Freq: Every day | ORAL | 0 refills | Status: DC

## 2019-01-19 ENCOUNTER — Other Ambulatory Visit: Payer: Self-pay | Admitting: Family Medicine

## 2019-01-21 NOTE — Telephone Encounter (Signed)
He may have a 30-day prescription with 1 refill needs office visit

## 2019-01-31 ENCOUNTER — Encounter: Payer: Self-pay | Admitting: Family Medicine

## 2019-01-31 ENCOUNTER — Ambulatory Visit: Payer: BLUE CROSS/BLUE SHIELD | Admitting: Family Medicine

## 2019-01-31 ENCOUNTER — Other Ambulatory Visit: Payer: Self-pay

## 2019-01-31 VITALS — Temp 97.5°F | Wt 228.0 lb

## 2019-01-31 DIAGNOSIS — M79676 Pain in unspecified toe(s): Secondary | ICD-10-CM | POA: Diagnosis not present

## 2019-01-31 MED ORDER — DOXYCYCLINE HYCLATE 100 MG PO TABS: 100.0000 mg | ORAL_TABLET | Freq: Two times a day (BID) | ORAL | 0 refills | Status: DC

## 2019-01-31 MED ORDER — INDOMETHACIN 50 MG PO CAPS: 50.0000 mg | ORAL_CAPSULE | Freq: Three times a day (TID) | ORAL | 0 refills | Status: AC

## 2019-01-31 MED ORDER — OXYCODONE-ACETAMINOPHEN 5-325 MG PO TABS: 1.0000 | ORAL_TABLET | ORAL | 0 refills | Status: DC | PRN

## 2019-01-31 NOTE — Progress Notes (Signed)
   Subjective:    Patient ID: Stephen Barnes, male    DOB: 1961-07-13, 58 y.o.   MRN: [id number]  HPI Pt here today for right foot being swollen. Pt states it began on Thursday and has became worse. Pt states it is painful. Pt has taken a pain pill and soaked it yesterday in some epsom salt . Pt seems to think it is gout but pt has not had gout. Pt unknown if he was bit by something.    Started with pain to right 5th MTP joint, gradually got worse, redness, warmth, and swelling. Denies fever or chills. No known injury. Able to bear weight but painful. Has been taking some oxycodone 2-3 times per day yesterday and today d/t severe pain. States rx was leftover from a previous shoulder surgery.   Family hx of gout. Denies personal hx of gout. Denies alcohol use. No change in his medications.   Review of Systems  Constitutional: Negative for chills and fever.  Musculoskeletal: Positive for arthralgias and joint swelling.  Skin: Positive for color change. Negative for wound.       Objective:   Physical Exam Vitals signs and nursing note reviewed.  Constitutional:      General: He is not in acute distress.    Appearance: Normal appearance. He is not toxic-appearing.  HENT:     Head: Normocephalic and atraumatic.  Cardiovascular:     Rate and Rhythm: Normal rate and regular rhythm.     Heart sounds: Normal heart sounds.  Pulmonary:     Effort: Pulmonary effort is normal. No respiratory distress.     Breath sounds: Normal breath sounds.  Musculoskeletal:     Comments: Right 5th MTP joint with significant erythema, edema, and warmth. Very tender to palpation. Distal pulses intact 2+. Skin intact, no evidence of cellulitis.   Skin:    General: Skin is warm and dry.  Neurological:     Mental Status: He is alert and oriented to person, place, and time.  Psychiatric:        Behavior: Behavior normal.           Assessment & Plan:  Pain of fifth toe - Plan: Uric acid, CBC with  Differential/Platelet, Sed Rate (ESR)  Pain and swelling to right 5th MTP joint. Possible gout vs septic arthritis. Pt is afebrile. Suspect gout though present in an unlikely joint. Will treat with indomethacin x 10 days to help with inflammation. Short course of pain meds given for severe pain, PDMP database checked. Will also cover for potential infection with doxy x 10 days. Will obtain some lab work as ordered above and f/u based on results. He should f/u if no improvement or worsening of symptoms, if he develops fever or chills. Pt verbalized understanding.   Dr. Mickie Hillier was consulted on this case and is in agreement with the above treatment plan.

## 2019-02-01 LAB — CBC WITH DIFFERENTIAL/PLATELET
Basophils Absolute: 0 10*3/uL (ref 0.0–0.2)
Basos: 0 %
EOS (ABSOLUTE): 0.1 10*3/uL (ref 0.0–0.4)
Eos: 1 %
Hematocrit: 44.2 % (ref 37.5–51.0)
Hemoglobin: 14.9 g/dL (ref 13.0–17.7)
Immature Grans (Abs): 0 10*3/uL (ref 0.0–0.1)
Immature Granulocytes: 0 %
Lymphocytes Absolute: 1.2 10*3/uL (ref 0.7–3.1)
Lymphs: 17 %
MCH: 30.4 pg (ref 26.6–33.0)
MCHC: 33.7 g/dL (ref 31.5–35.7)
MCV: 90 fL (ref 79–97)
MONOS ABS: 0.5 10*3/uL (ref 0.1–0.9)
Monocytes: 7 %
NEUTROS PCT: 75 %
Neutrophils Absolute: 5.4 10*3/uL (ref 1.4–7.0)
PLATELETS: 260 10*3/uL (ref 150–450)
RBC: 4.9 x10E6/uL (ref 4.14–5.80)
RDW: 13.1 % (ref 11.6–15.4)
WBC: 7.1 10*3/uL (ref 3.4–10.8)

## 2019-02-01 LAB — SEDIMENTATION RATE: Sed Rate: 4 mm/hr (ref 0–30)

## 2019-02-01 LAB — URIC ACID: Uric Acid: 7.6 mg/dL (ref 3.7–8.6)

## 2019-04-17 DIAGNOSIS — H40013 Open angle with borderline findings, low risk, bilateral: Secondary | ICD-10-CM | POA: Diagnosis not present

## 2019-04-17 DIAGNOSIS — H25811 Combined forms of age-related cataract, right eye: Secondary | ICD-10-CM | POA: Diagnosis not present

## 2019-06-12 ENCOUNTER — Other Ambulatory Visit: Payer: Self-pay | Admitting: Ophthalmology

## 2019-06-14 NOTE — H&P (Signed)
Surgical History & Physical  Patient Name: Stephen Barnes DOB: [birth year]  Surgery: Cataract extraction with intraocular lens implant phacoemulsification; Right Eye  Surgeon: Baruch Goldmann MD Surgery Date:  06/23/2019 Pre-Op Date:  06/06/2019  HPI: A 82 Yr. old male patient 1. 1. The patient complains of difficulty when viewing TV, reading closed caption, news scrolls on TV, which began 1 year ago. The right eye is affected. Symptoms occur when the patient is driving and outside. The complaint is associated with glare. This is negatively affecting the patient's quality of life. Pt is not using eye drops, denies eye pain. HPI was performed by Baruch Goldmann .  Medical History:  High Blood Pressure  Review of Systems Negative Allergic/Immunologic Negative Cardiovascular Negative Constitutional Negative Ear, Nose, Mouth & Throat Negative Endocrine Negative Eyes Negative Gastrointestinal Negative Genitourinary Negative Hemotologic/Lymphatic Negative Integumentary Negative Musculoskeletal Negative Neurological Negative Psychiatry Negative Respiratory  Social   Former smoker   Medication Lisinopril,   Sx/Procedures Shoulder surgery, Knee Surgery, Hand Surgery,   Drug Allergies   NKDA  History & Physical: Heent:  Cataract, Right eye NECK: supple without bruits LUNGS: lungs clear to auscultation CV: regular rate and rhythm Abdomen: soft and non-tender  Impression & Plan: Assessment: 1.  COMBINED FORMS AGE RELATED CATARACT; Right Eye (H25.811) 2.  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  Cataract accounts for the patient's decreased vision. This visual impairment is not correctable with a tolerable change in glasses or contact lenses. Cataract surgery with an implantation of a new lens should significantly improve the visual and functional status of the patient. Discussed all risks, benefits, alternatives, and potential complications. Discussed the  procedures and recovery. Patient desires to have surgery. A-scan ordered and performed today for intra-ocular lens calculations. The surgery will be performed in order to improve vision for driving, reading, and for eye examinations. Recommend phacoemulsification with intra-ocular lens. Right Eye Dilates well - shugarcaine by protocol. Symfony Lens - consider 2.  Based on cup-to-disc ratio. Negative Family history. OCT rFNL today shows: Detailed discussion about glaucoma today including importance of maintaining good follow up and following treatment plan, and the possibility of irreversible blindness as part of this disease process.

## 2019-06-16 DIAGNOSIS — H25812 Combined forms of age-related cataract, left eye: Secondary | ICD-10-CM | POA: Diagnosis not present

## 2019-06-20 ENCOUNTER — Encounter (HOSPITAL_COMMUNITY)
Admission: RE | Admit: 2019-06-20 | Discharge: 2019-06-20 | Disposition: A | Discharge status: Home / Self Care | Payer: BC Managed Care – PPO | Source: Ambulatory Visit | Attending: Ophthalmology | Admitting: Ophthalmology

## 2019-06-20 ENCOUNTER — Other Ambulatory Visit: Payer: Self-pay

## 2019-06-20 ENCOUNTER — Encounter (HOSPITAL_COMMUNITY): Payer: Self-pay

## 2019-06-21 ENCOUNTER — Other Ambulatory Visit (HOSPITAL_COMMUNITY)
Admission: RE | Admit: 2019-06-21 | Discharge: 2019-06-21 | Disposition: A | Discharge status: Home / Self Care | Payer: BC Managed Care – PPO | Source: Ambulatory Visit | Attending: Ophthalmology | Admitting: Ophthalmology

## 2019-06-21 DIAGNOSIS — Z01812 Encounter for preprocedural laboratory examination: Secondary | ICD-10-CM | POA: Insufficient documentation

## 2019-06-21 DIAGNOSIS — Z20828 Contact with and (suspected) exposure to other viral communicable diseases: Secondary | ICD-10-CM | POA: Diagnosis not present

## 2019-06-21 LAB — SARS CORONAVIRUS 2 (TAT 6-24 HRS): SARS Coronavirus 2: NEGATIVE

## 2019-06-23 ENCOUNTER — Encounter (HOSPITAL_COMMUNITY): Payer: Self-pay | Admitting: *Deleted

## 2019-06-23 ENCOUNTER — Ambulatory Visit (HOSPITAL_COMMUNITY): Payer: BC Managed Care – PPO | Admitting: Anesthesiology

## 2019-06-23 ENCOUNTER — Encounter (HOSPITAL_COMMUNITY)
Admission: RE | Disposition: A | Discharge status: Home / Self Care | Payer: Self-pay | Source: Home / Self Care | Attending: Ophthalmology

## 2019-06-23 ENCOUNTER — Other Ambulatory Visit: Payer: Self-pay

## 2019-06-23 ENCOUNTER — Ambulatory Visit (HOSPITAL_COMMUNITY)
Admission: RE | Admit: 2019-06-23 | Discharge: 2019-06-23 | Disposition: A | Discharge status: Home / Self Care | Payer: BC Managed Care – PPO | Attending: Ophthalmology | Admitting: Ophthalmology

## 2019-06-23 DIAGNOSIS — Z79899 Other long term (current) drug therapy: Secondary | ICD-10-CM | POA: Insufficient documentation

## 2019-06-23 DIAGNOSIS — H25811 Combined forms of age-related cataract, right eye: Secondary | ICD-10-CM | POA: Diagnosis not present

## 2019-06-23 DIAGNOSIS — I10 Essential (primary) hypertension: Secondary | ICD-10-CM | POA: Insufficient documentation

## 2019-06-23 DIAGNOSIS — Z87891 Personal history of nicotine dependence: Secondary | ICD-10-CM | POA: Insufficient documentation

## 2019-06-23 HISTORY — PX: CATARACT EXTRACTION W/PHACO: SHX586

## 2019-06-23 SURGERY — PHACOEMULSIFICATION, CATARACT, WITH IOL INSERTION
Anesthesia: Monitor Anesthesia Care | Site: Eye | Laterality: Right

## 2019-06-23 MED ORDER — LIDOCAINE HCL 3.5 % OP GEL
1.0000 "application " | Freq: Once | OPHTHALMIC | Status: AC
  Administered 2019-06-23: 1 via OPHTHALMIC

## 2019-06-23 MED ORDER — NEOMYCIN-POLYMYXIN-DEXAMETH 3.5-10000-0.1 OP SUSP
OPHTHALMIC | Status: DC | PRN
  Administered 2019-06-23: 2 [drp] via OPHTHALMIC

## 2019-06-23 MED ORDER — MIDAZOLAM HCL 2 MG/2ML IJ SOLN
INTRAMUSCULAR | Status: AC
  Filled 2019-06-23: qty 2

## 2019-06-23 MED ORDER — LIDOCAINE HCL (PF) 1 % IJ SOLN
INTRAOCULAR | Status: DC | PRN
  Administered 2019-06-23: 09:00:00 1 mL via OPHTHALMIC

## 2019-06-23 MED ORDER — EPINEPHRINE PF 1 MG/ML IJ SOLN
INTRAOCULAR | Status: DC | PRN
  Administered 2019-06-23: 09:00:00 500 mL

## 2019-06-23 MED ORDER — PROVISC 10 MG/ML IO SOLN
INTRAOCULAR | Status: DC | PRN
  Administered 2019-06-23: 0.85 mL via INTRAOCULAR

## 2019-06-23 MED ORDER — CYCLOPENTOLATE-PHENYLEPHRINE 0.2-1 % OP SOLN
1.0000 [drp] | OPHTHALMIC | Status: AC | PRN
  Administered 2019-06-23 (×3): 1 [drp] via OPHTHALMIC

## 2019-06-23 MED ORDER — PHENYLEPHRINE HCL 2.5 % OP SOLN
1.0000 [drp] | OPHTHALMIC | Status: AC | PRN
  Administered 2019-06-23 (×3): 1 [drp] via OPHTHALMIC

## 2019-06-23 MED ORDER — BSS IO SOLN
INTRAOCULAR | Status: DC | PRN
  Administered 2019-06-23: 15 mL via INTRAOCULAR

## 2019-06-23 MED ORDER — SODIUM CHLORIDE 0.9% FLUSH
10.0000 mL | INTRAVENOUS | Status: DC | PRN
  Administered 2019-06-23: 09:00:00 3 mL via INTRAVENOUS
  Filled 2019-06-23: qty 10

## 2019-06-23 MED ORDER — POVIDONE-IODINE 5 % OP SOLN
OPHTHALMIC | Status: DC | PRN
  Administered 2019-06-23: 1 via OPHTHALMIC

## 2019-06-23 MED ORDER — TETRACAINE HCL 0.5 % OP SOLN
1.0000 [drp] | OPHTHALMIC | Status: AC | PRN
  Administered 2019-06-23 (×3): 1 [drp] via OPHTHALMIC

## 2019-06-23 MED ORDER — SODIUM HYALURONATE 23 MG/ML IO SOLN
INTRAOCULAR | Status: DC | PRN
  Administered 2019-06-23: 0.6 mL via INTRAOCULAR

## 2019-06-23 SURGICAL SUPPLY — 15 items
CLOTH BEACON ORANGE TIMEOUT ST (SAFETY) ×1 IMPLANT
EYE SHIELD UNIVERSAL CLEAR (GAUZE/BANDAGES/DRESSINGS) ×1 IMPLANT
GLOVE BIOGEL PI IND STRL 7.0 (GLOVE) IMPLANT
GLOVE BIOGEL PI INDICATOR 7.0 (GLOVE) ×2
LENS IOL TECNIS SYMFONY 21.0 ×1 IMPLANT
NDL HYPO 18GX1.5 BLUNT FILL (NEEDLE) IMPLANT
NEEDLE HYPO 18GX1.5 BLUNT FILL (NEEDLE) ×2 IMPLANT
PAD ARMBOARD 7.5X6 YLW CONV (MISCELLANEOUS) ×1 IMPLANT
PROC W SPEC LENS (INTRAOCULAR LENS) ×2
PROCESS W SPEC LENS (INTRAOCULAR LENS) IMPLANT
SYR TB 1ML LL NO SAFETY (SYRINGE) ×1 IMPLANT
TAPE SURG TRANSPORE 1 IN (GAUZE/BANDAGES/DRESSINGS) IMPLANT
TAPE SURGICAL TRANSPORE 1 IN (GAUZE/BANDAGES/DRESSINGS) ×1
VISCOELASTIC ADDITIONAL (OPHTHALMIC RELATED) ×1 IMPLANT
WATER STERILE IRR 250ML POUR (IV SOLUTION) ×1 IMPLANT

## 2019-06-23 NOTE — Anesthesia Preprocedure Evaluation (Signed)
Anesthesia Evaluation  Patient identified by MRN, date of birth, ID band Patient awake    Reviewed: Allergy & Precautions, NPO status , Patient's Chart, lab work & pertinent test results  Airway Mallampati: III       Dental   Both upper and lower crowns:   Pulmonary former smoker,    Pulmonary exam normal        Cardiovascular Exercise Tolerance: Good hypertension (did not take meds today), Pt. on medications Normal cardiovascular exam     Neuro/Psych negative psych ROS   GI/Hepatic negative GI ROS, Neg liver ROS,   Endo/Other  negative endocrine ROS  Renal/GU negative Renal ROS     Musculoskeletal   Abdominal   Peds  Hematology negative hematology ROS (+)   Anesthesia Other Findings   Reproductive/Obstetrics                             Anesthesia Physical Anesthesia Plan  ASA: II  Anesthesia Plan: MAC   Post-op Pain Management:    Induction:   PONV Risk Score and Plan:   Airway Management Planned: Nasal Cannula  Additional Equipment:   Intra-op Plan:   Post-operative Plan:   Informed Consent: I have reviewed the patients History and Physical, chart, labs and discussed the procedure including the risks, benefits and alternatives for the proposed anesthesia with the patient or authorized representative who has indicated his/her understanding and acceptance.       Plan Discussed with: CRNA  Anesthesia Plan Comments:         Anesthesia Quick Evaluation

## 2019-06-23 NOTE — Anesthesia Postprocedure Evaluation (Signed)
Anesthesia Post Note  Patient: BRINSON TOZZI  Procedure(s) Performed: CATARACT EXTRACTION PHACO AND INTRAOCULAR LENS PLACEMENT (IOC) (Right Eye)  Patient location during evaluation: Short Stay Anesthesia Type: MAC Level of consciousness: awake and alert and patient cooperative Pain management: pain level controlled Vital Signs Assessment: post-procedure vital signs reviewed and stable Respiratory status: spontaneous breathing Cardiovascular status: stable Postop Assessment: no apparent nausea or vomiting Anesthetic complications: no     Last Vitals:  Vitals:   06/23/19 0813  BP: 121/83  Pulse: 62  Resp: (!) 21  Temp: 36.8 C  SpO2: 98%    Last Pain:  Vitals:   06/23/19 0813  TempSrc: Oral  PainSc: 0-No pain                 Mychelle Kendra

## 2019-06-23 NOTE — Anesthesia Procedure Notes (Signed)
Procedure Name: MAC Date/Time: 06/23/2019 8:56 AM Performed by: Vista Deck, CRNA Pre-anesthesia Checklist: Patient identified, Emergency Drugs available, Suction available, Timeout performed and Patient being monitored Patient Re-evaluated:Patient Re-evaluated prior to induction Oxygen Delivery Method: Nasal Cannula

## 2019-06-23 NOTE — Discharge Instructions (Signed)
Please discharge patient when stable, will follow up today with Dr. Eliav Mechling at the [facility] office immediately following discharge.  Leave shield in place until visit.  All paperwork with discharge instructions will be given at the office. ° °

## 2019-06-23 NOTE — Op Note (Signed)
Date of procedure: 06/23/19  Pre-operative diagnosis: Visually significant age-related cataract, Right Eye (H25.811)  Post-operative diagnosis: Visually significant age-related cataract, Right Eye  Procedure: Removal of cataract via phacoemulsification and insertion of intra-ocular lens Wynetta Emery and [employer] ZXR00  +21.0D into the capsular bag of the Right Eye  Attending surgeon: Gerda Diss. Roya Gieselman, MD, MA  Anesthesia: MAC, Topical Akten  Complications: None  Estimated Blood Loss: <59m (minimal)  Specimens: None  Implants: As above  Indications:  Visually significant age-related cataract, Right Eye  Procedure:  The patient was seen and identified in the pre-operative area. The operative eye was identified and dilated.  The operative eye was marked.  Topical anesthesia was administered to the operative eye.     The patient was then to the operative suite and placed in the supine position.  A timeout was performed confirming the patient, procedure to be performed, and all other relevant information.   The patient's face was prepped and draped in the usual fashion for intra-ocular surgery.  A lid speculum was placed into the operative eye and the surgical microscope moved into place and focused.  A superotemporal paracentesis was created using a 20 gauge paracentesis blade.  Shugarcaine was injected into the anterior chamber.  Viscoelastic was injected into the anterior chamber.  A temporal clear-corneal main wound incision was created using a 2.454mmicrokeratome.  A continuous curvilinear capsulorrhexis was initiated using an irrigating cystitome and completed using capsulorrhexis forceps.  Hydrodissection and hydrodeliniation were performed.  Viscoelastic was injected into the anterior chamber.  A phacoemulsification handpiece and a chopper as a second instrument were used to remove the nucleus and epinucleus. The irrigation/aspiration handpiece was used to remove any remaining cortical  material.   The capsular bag was reinflated with viscoelastic, checked, and found to be intact.  The intraocular lens was inserted into the capsular bag and dialed into place using a Kuglen hook.  The irrigation/aspiration handpiece was used to remove any remaining viscoelastic.  The clear corneal wound and paracentesis wounds were then hydrated and checked with Weck-Cels to be watertight.  The lid-speculum and drape was removed, and the patient's face was cleaned with a wet and dry 4x4.  Maxitrol was instilled in the eye before a clear shield was taped over the eye. The patient was taken to the post-operative care unit in good condition, having tolerated the procedure well.  Post-Op Instructions: [facility] and will receive all other orders and instructions.

## 2019-06-23 NOTE — Interval H&P Note (Signed)
History and Physical Interval Note:  The H and P was reviewed and updated. The patient was examined.  No changes were found after exam.  The surgical eye was marked.   06/23/2019 8:54 AM  Stephen Barnes  has presented today for surgery, with the diagnosis of nuclear cataract right eye.  The various methods of treatment have been discussed with the patient and family. After consideration of risks, benefits and other options for treatment, the patient has consented to  Procedure(s) with comments: CATARACT EXTRACTION PHACO AND INTRAOCULAR LENS PLACEMENT (IOC) (Right) - CDE:  as a surgical intervention.  The patient's history has been reviewed, patient examined, no change in status, stable for surgery.  I have reviewed the patient's chart and labs.  Questions were answered to the patient's satisfaction.     Baruch Goldmann

## 2019-06-23 NOTE — Transfer of Care (Signed)
Immediate Anesthesia Transfer of Care Note  Patient: Stephen Barnes  Procedure(s) Performed: CATARACT EXTRACTION PHACO AND INTRAOCULAR LENS PLACEMENT (IOC) (Right Eye)  Patient Location: Short Stay  Anesthesia Type:MAC  Level of Consciousness: awake, alert , oriented and patient cooperative  Airway & Oxygen Therapy: Patient Spontanous Breathing  Post-op Assessment: Report given to RN and Post -op Vital signs reviewed and stable  Post vital signs: Reviewed and stable  Last Vitals:  Vitals Value Taken Time  BP 148/89   Temp 97.8   Pulse 60   Resp 18   SpO2 96     Last Pain:  Vitals:   06/23/19 0813  TempSrc: Oral  PainSc: 0-No pain         Complications: No apparent anesthesia complications

## 2019-06-27 ENCOUNTER — Encounter (HOSPITAL_COMMUNITY): Payer: Self-pay | Admitting: Ophthalmology

## 2019-07-05 ENCOUNTER — Encounter (HOSPITAL_COMMUNITY): Admission: RE | Admit: 2019-07-05 | Payer: BC Managed Care – PPO | Source: Ambulatory Visit

## 2019-07-05 ENCOUNTER — Other Ambulatory Visit (HOSPITAL_COMMUNITY)
Admission: RE | Admit: 2019-07-05 | Discharge: 2019-07-05 | Disposition: A | Discharge status: Home / Self Care | Payer: BC Managed Care – PPO | Source: Ambulatory Visit | Attending: Ophthalmology | Admitting: Ophthalmology

## 2019-07-07 ENCOUNTER — Ambulatory Visit (HOSPITAL_COMMUNITY): Admission: RE | Admit: 2019-07-07 | Payer: BC Managed Care – PPO | Source: Home / Self Care | Admitting: Ophthalmology

## 2019-07-07 ENCOUNTER — Encounter (HOSPITAL_COMMUNITY): Admission: RE | Payer: Self-pay | Source: Home / Self Care

## 2019-07-07 SURGERY — PHACOEMULSIFICATION, CATARACT, WITH IOL INSERTION
Anesthesia: Monitor Anesthesia Care | Laterality: Left

## 2019-07-31 ENCOUNTER — Other Ambulatory Visit: Payer: Self-pay | Admitting: Family Medicine

## 2019-08-01 NOTE — Telephone Encounter (Signed)
This patient really needs a office visit. The patient does not want to come in then virtual visit May have 30-day refill

## 2019-08-07 ENCOUNTER — Telehealth: Payer: Self-pay | Admitting: Family Medicine

## 2019-08-07 MED ORDER — LISINOPRIL 10 MG PO TABS: 10.0000 mg | ORAL_TABLET | Freq: Every day | ORAL | 0 refills | Status: DC

## 2019-08-07 NOTE — Telephone Encounter (Signed)
May have 30-day on medication thank you

## 2019-08-07 NOTE — Telephone Encounter (Signed)
Patient is requesting refill on blood pressure medication lisinopril 10 mg completely out. Has appointment 9/29 for 6 month followup. [employer]

## 2019-08-07 NOTE — Telephone Encounter (Signed)
Medication refill sent to pharmacy. Left message to return call

## 2019-08-07 NOTE — Telephone Encounter (Signed)
Last seen 01/31/2019 for toe pain. Please advise. Thank you

## 2019-08-08 NOTE — Telephone Encounter (Signed)
Left message to return call 

## 2019-08-09 NOTE — Telephone Encounter (Signed)
Patient has appointment on 9/29 for follow up visit

## 2019-08-09 NOTE — Telephone Encounter (Signed)
Patient picked up med per [facility]

## 2019-08-09 NOTE — Telephone Encounter (Signed)
Left message to schedule appointment

## 2019-08-15 ENCOUNTER — Ambulatory Visit (INDEPENDENT_AMBULATORY_CARE_PROVIDER_SITE_OTHER): Payer: BC Managed Care – PPO | Admitting: Family Medicine

## 2019-08-15 ENCOUNTER — Encounter: Payer: Self-pay | Admitting: Family Medicine

## 2019-08-15 ENCOUNTER — Other Ambulatory Visit: Payer: Self-pay

## 2019-08-15 VITALS — BP 124/82

## 2019-08-15 DIAGNOSIS — Z79899 Other long term (current) drug therapy: Secondary | ICD-10-CM

## 2019-08-15 DIAGNOSIS — I10 Essential (primary) hypertension: Secondary | ICD-10-CM

## 2019-08-15 DIAGNOSIS — Z125 Encounter for screening for malignant neoplasm of prostate: Secondary | ICD-10-CM

## 2019-08-15 DIAGNOSIS — E785 Hyperlipidemia, unspecified: Secondary | ICD-10-CM

## 2019-08-15 HISTORY — DX: Essential (primary) hypertension: I10

## 2019-08-15 MED ORDER — LISINOPRIL 10 MG PO TABS: 10.0000 mg | ORAL_TABLET | Freq: Every day | ORAL | 5 refills | Status: DC

## 2019-08-15 NOTE — Progress Notes (Signed)
   Subjective:    Patient ID: Stephen Barnes, male    DOB: 1961-07-31, 58 y.o.   MRN: [id number]  Hypertension This is a chronic problem. Pertinent negatives include no chest pain, headaches or shortness of breath. There are no compliance problems (taking Lisinopril 10 mg daily ).   pt states he has not checked BP today but will here shortly. Pt states that each time he has checked his blood pressure, it has been good. 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  States energy level breathing doing okay does not smoke PMH HTN Virtual Visit via Video Note  I connected with Stephen Barnes on 08/15/19 at 10:00 AM EDT by a video enabled telemedicine application and verified that I am speaking with the correct person using two identifiers.  Location: Patient: home Provider: office   I discussed the limitations of evaluation and management by telemedicine and the availability of in person appointments. The patient expressed understanding and agreed to proceed.  History of Present Illness:    Observations/Objective:   Assessment and Plan:   Follow Up Instructions:    I discussed the assessment and treatment plan with the patient. The patient was provided an opportunity to ask questions and all were answered. The patient agreed with the plan and demonstrated an understanding of the instructions.   The patient was advised to call back or seek an in-person evaluation if the symptoms worsen or if the condition fails to improve as anticipated.  I provided 16 minutes of non-face-to-face time during this encounter.   Vicente Males, LPN     Review of Systems  Constitutional: Negative for diaphoresis and fatigue.  HENT: Negative for congestion and rhinorrhea.   Respiratory: Negative for cough and shortness of breath.    Cardiovascular: Negative for chest pain and leg swelling.  Gastrointestinal: Negative for abdominal pain and diarrhea.  Skin: Negative for color change and rash.  Neurological: Negative for dizziness and headaches.  Psychiatric/Behavioral: Negative for behavioral problems and confusion.       Objective:   Physical Exam   Today's visit was via telephone Physical exam was not possible for this visit       Assessment & Plan:  HTN Decent control overall Very important to the patient watch diet Minimize salt Exercise regular basis Lab work indicated Refills given Follow-up 6 months

## 2019-09-05 DIAGNOSIS — Z125 Encounter for screening for malignant neoplasm of prostate: Secondary | ICD-10-CM | POA: Diagnosis not present

## 2019-09-05 DIAGNOSIS — I10 Essential (primary) hypertension: Secondary | ICD-10-CM | POA: Diagnosis not present

## 2019-09-05 DIAGNOSIS — E785 Hyperlipidemia, unspecified: Secondary | ICD-10-CM | POA: Diagnosis not present

## 2019-09-05 DIAGNOSIS — Z79899 Other long term (current) drug therapy: Secondary | ICD-10-CM | POA: Diagnosis not present

## 2019-09-06 LAB — BASIC METABOLIC PANEL
BUN/Creatinine Ratio: 11 (ref 9–20)
BUN: 14 mg/dL (ref 6–24)
CO2: 25 mmol/L (ref 20–29)
Calcium: 9.5 mg/dL (ref 8.7–10.2)
Chloride: 107 mmol/L — ABNORMAL HIGH (ref 96–106)
Creatinine, Ser: 1.29 mg/dL — ABNORMAL HIGH (ref 0.76–1.27)
GFR calc Af Amer: 71 mL/min/{1.73_m2} (ref >59)
GFR calc non Af Amer: 61 mL/min/{1.73_m2} (ref >59)
Glucose: 95 mg/dL (ref 65–99)
Potassium: 4.3 mmol/L (ref 3.5–5.2)
Sodium: 144 mmol/L (ref 134–144)

## 2019-09-06 LAB — HEPATIC FUNCTION PANEL
ALT: 27 IU/L (ref 0–44)
AST: 21 IU/L (ref 0–40)
Albumin: 4.3 g/dL (ref 3.8–4.9)
Alkaline Phosphatase: 78 IU/L (ref 39–117)
Bilirubin Total: 0.5 mg/dL (ref 0.0–1.2)
Bilirubin, Direct: 0.1 mg/dL (ref 0.00–0.40)
Total Protein: 6.6 g/dL (ref 6.0–8.5)

## 2019-09-06 LAB — PSA: Prostate Specific Ag, Serum: 0.5 ng/mL (ref 0.0–4.0)

## 2019-09-06 LAB — LIPID PANEL
Chol/HDL Ratio: 5.4 ratio — ABNORMAL HIGH (ref 0.0–5.0)
Cholesterol, Total: 184 mg/dL (ref 100–199)
HDL: 34 mg/dL — ABNORMAL LOW (ref >39)
LDL Chol Calc (NIH): 127 mg/dL — ABNORMAL HIGH (ref 0–99)
Triglycerides: 129 mg/dL (ref 0–149)
VLDL Cholesterol Cal: 23 mg/dL (ref 5–40)

## 2019-09-08 ENCOUNTER — Telehealth: Payer: Self-pay | Admitting: Family Medicine

## 2019-09-08 NOTE — Telephone Encounter (Signed)
I called both numbers in chart and no answer. Unable to leave a message. I called [place] [facility] to see why med was discontinued and they state it is not and they just run it through for the pt and it went through fine. Med is still on med list and I didn't see in last ov note that dr Richardson Landry had discontinued it.

## 2019-09-08 NOTE — Telephone Encounter (Signed)
Per wife, Lisinopril has been discontinued and he needs a different medication called in to [place]

## 2019-09-08 NOTE — Telephone Encounter (Signed)
See result note also 

## 2019-09-14 NOTE — Telephone Encounter (Signed)
Left message to return call 

## 2019-09-14 NOTE — Telephone Encounter (Signed)
Patient is on his Lisinopril and doesn't need it changed- last time he went to pick it up the pharmacy told him he had to contact the provider and her thought that meant it needed to be changed but he found out he needed an office visit and has completed the office visit

## 2019-09-26 ENCOUNTER — Other Ambulatory Visit: Payer: Self-pay | Admitting: *Deleted

## 2019-09-26 ENCOUNTER — Telehealth: Payer: Self-pay | Admitting: Family Medicine

## 2019-09-26 DIAGNOSIS — E785 Hyperlipidemia, unspecified: Secondary | ICD-10-CM

## 2019-09-26 DIAGNOSIS — Z79899 Other long term (current) drug therapy: Secondary | ICD-10-CM

## 2019-09-26 MED ORDER — ROSUVASTATIN CALCIUM 10 MG PO TABS: 10.0000 mg | ORAL_TABLET | Freq: Every day | ORAL | 1 refills | Status: DC

## 2019-09-26 NOTE — Telephone Encounter (Signed)
Pt notified script sent in and to do bw in 8 -12 weeks.

## 2019-09-26 NOTE — Telephone Encounter (Signed)
Crestor 10 mg 1 daily, #30, 5 refills Repeat lipid liver profile in 8 to 12 weeks

## 2019-09-26 NOTE — Telephone Encounter (Signed)
No cholesterol on med list so I looked at result note and it looks like you wanted to start him on med. Could not find in notes what you wanted to prescribe. Please advise.

## 2019-09-26 NOTE — Telephone Encounter (Signed)
Pt is checking on cholesterol medicine that was suppose to be called in last week.   [place], [place]

## 2020-02-08 DIAGNOSIS — Z961 Presence of intraocular lens: Secondary | ICD-10-CM | POA: Diagnosis not present

## 2020-02-08 DIAGNOSIS — H2512 Age-related nuclear cataract, left eye: Secondary | ICD-10-CM | POA: Diagnosis not present

## 2020-02-29 DIAGNOSIS — Z79899 Other long term (current) drug therapy: Secondary | ICD-10-CM | POA: Diagnosis not present

## 2020-02-29 DIAGNOSIS — E785 Hyperlipidemia, unspecified: Secondary | ICD-10-CM | POA: Diagnosis not present

## 2020-03-01 LAB — HEPATIC FUNCTION PANEL
ALT: 31 IU/L (ref 0–44)
AST: 22 IU/L (ref 0–40)
Albumin: 4.9 g/dL (ref 3.8–4.9)
Alkaline Phosphatase: 89 IU/L (ref 39–117)
Bilirubin Total: 0.4 mg/dL (ref 0.0–1.2)
Bilirubin, Direct: 0.11 mg/dL (ref 0.00–0.40)
Total Protein: 7.1 g/dL (ref 6.0–8.5)

## 2020-03-01 LAB — LIPID PANEL
Chol/HDL Ratio: 5 ratio (ref 0.0–5.0)
Cholesterol, Total: 164 mg/dL (ref 100–199)
HDL: 33 mg/dL — ABNORMAL LOW (ref >39)
LDL Chol Calc (NIH): 106 mg/dL — ABNORMAL HIGH (ref 0–99)
Triglycerides: 138 mg/dL (ref 0–149)
VLDL Cholesterol Cal: 25 mg/dL (ref 5–40)

## 2020-04-09 ENCOUNTER — Other Ambulatory Visit: Payer: Self-pay | Admitting: Family Medicine

## 2020-04-10 NOTE — Telephone Encounter (Signed)
Please contact patient and have him set up appointment. Then may route back to nurses. Thank you

## 2020-04-11 NOTE — Telephone Encounter (Signed)
lvm to schedule appt.  

## 2020-04-12 NOTE — Telephone Encounter (Signed)
lvm to schedule appt.  

## 2020-04-16 NOTE — Telephone Encounter (Signed)
lvm to schedule appt.  

## 2020-06-15 ENCOUNTER — Other Ambulatory Visit: Payer: Self-pay | Admitting: Family Medicine

## 2020-06-15 DIAGNOSIS — I10 Essential (primary) hypertension: Secondary | ICD-10-CM

## 2020-06-15 DIAGNOSIS — E785 Hyperlipidemia, unspecified: Secondary | ICD-10-CM

## 2020-06-15 DIAGNOSIS — Z79899 Other long term (current) drug therapy: Secondary | ICD-10-CM

## 2020-06-15 DIAGNOSIS — Z125 Encounter for screening for malignant neoplasm of prostate: Secondary | ICD-10-CM

## 2020-06-17 ENCOUNTER — Telehealth: Payer: Self-pay | Admitting: Family Medicine

## 2020-06-17 MED ORDER — LISINOPRIL 10 MG PO TABS: ORAL_TABLET | ORAL | 0 refills | Status: DC

## 2020-06-17 NOTE — Telephone Encounter (Signed)
Prescription sent electronically to pharmacy. Patient notified. 

## 2020-06-17 NOTE — Telephone Encounter (Signed)
Lipid, liver, metabolic 7, PSA Hypertension hyperlipidemia prostate cancer screening

## 2020-06-17 NOTE — Telephone Encounter (Signed)
Patient is requesting refill on lisinopril 10 mg and he schedule appointment for medication follow up for 8/25. [employer]

## 2020-06-18 NOTE — Telephone Encounter (Signed)
Orders put in and left message to return call with pt

## 2020-06-18 NOTE — Telephone Encounter (Signed)
Patient notified

## 2020-07-04 DIAGNOSIS — E785 Hyperlipidemia, unspecified: Secondary | ICD-10-CM | POA: Diagnosis not present

## 2020-07-04 DIAGNOSIS — Z125 Encounter for screening for malignant neoplasm of prostate: Secondary | ICD-10-CM | POA: Diagnosis not present

## 2020-07-04 DIAGNOSIS — Z79899 Other long term (current) drug therapy: Secondary | ICD-10-CM | POA: Diagnosis not present

## 2020-07-04 DIAGNOSIS — I10 Essential (primary) hypertension: Secondary | ICD-10-CM | POA: Diagnosis not present

## 2020-07-05 LAB — PSA: Prostate Specific Ag, Serum: 0.5 ng/mL (ref 0.0–4.0)

## 2020-07-05 LAB — BASIC METABOLIC PANEL
BUN/Creatinine Ratio: 13 (ref 9–20)
BUN: 17 mg/dL (ref 6–24)
CO2: 23 mmol/L (ref 20–29)
Calcium: 9.8 mg/dL (ref 8.7–10.2)
Chloride: 106 mmol/L (ref 96–106)
Creatinine, Ser: 1.3 mg/dL — ABNORMAL HIGH (ref 0.76–1.27)
GFR calc Af Amer: 70 mL/min/{1.73_m2} (ref >59)
GFR calc non Af Amer: 60 mL/min/{1.73_m2} (ref >59)
Glucose: 95 mg/dL (ref 65–99)
Potassium: 4.5 mmol/L (ref 3.5–5.2)
Sodium: 143 mmol/L (ref 134–144)

## 2020-07-05 LAB — LIPID PANEL
Chol/HDL Ratio: 5 ratio (ref 0.0–5.0)
Cholesterol, Total: 171 mg/dL (ref 100–199)
HDL: 34 mg/dL — ABNORMAL LOW (ref >39)
LDL Chol Calc (NIH): 105 mg/dL — ABNORMAL HIGH (ref 0–99)
Triglycerides: 182 mg/dL — ABNORMAL HIGH (ref 0–149)
VLDL Cholesterol Cal: 32 mg/dL (ref 5–40)

## 2020-07-05 LAB — HEPATIC FUNCTION PANEL
ALT: 25 IU/L (ref 0–44)
AST: 24 IU/L (ref 0–40)
Albumin: 4.7 g/dL (ref 3.8–4.9)
Alkaline Phosphatase: 81 IU/L (ref 48–121)
Bilirubin Total: 0.6 mg/dL (ref 0.0–1.2)
Bilirubin, Direct: 0.15 mg/dL (ref 0.00–0.40)
Total Protein: 7.1 g/dL (ref 6.0–8.5)

## 2020-07-10 ENCOUNTER — Ambulatory Visit: Payer: BC Managed Care – PPO | Admitting: Family Medicine

## 2020-07-10 ENCOUNTER — Encounter: Payer: Self-pay | Admitting: Family Medicine

## 2020-07-10 ENCOUNTER — Other Ambulatory Visit: Payer: Self-pay | Admitting: *Deleted

## 2020-07-10 ENCOUNTER — Other Ambulatory Visit: Payer: Self-pay

## 2020-07-10 VITALS — BP 128/76 | HR 93 | Temp 97.8°F | Ht 71.0 in | Wt 221.0 lb

## 2020-07-10 DIAGNOSIS — K439 Ventral hernia without obstruction or gangrene: Secondary | ICD-10-CM

## 2020-07-10 DIAGNOSIS — R5383 Other fatigue: Secondary | ICD-10-CM | POA: Diagnosis not present

## 2020-07-10 DIAGNOSIS — I10 Essential (primary) hypertension: Secondary | ICD-10-CM

## 2020-07-10 NOTE — Progress Notes (Signed)
Subjective:    Patient ID: Stephen Barnes, male    DOB: [birth year], 59 y.o.   MRN: [id number]  Hypertension This is a chronic problem. Pertinent negatives include no chest pain, headaches or shortness of breath. Treatments tried: lisinopril 10mg . Compliance problems include exercise.    Pt states he never started rosuvastatin. He states he has been working on diet instead.  The patient relates that he is doing well with taking his medications for blood pressure but relates he is not taking the cholesterol medicine.  He did do his lab work.  I calculated out his risk for heart disease well over 10% Highly recommended for him to start the cholesterol medicine patient had stopped smoking.   Review of Systems  Constitutional: Negative for activity change.  HENT: Negative for congestion and rhinorrhea.   Respiratory: Negative for cough and shortness of breath.   Cardiovascular: Negative for chest pain.  Gastrointestinal: Negative for abdominal pain, diarrhea, nausea and vomiting.  Genitourinary: Negative for dysuria and hematuria.  Neurological: Negative for weakness and headaches.  Psychiatric/Behavioral: Negative for behavioral problems and confusion.       Objective:   Physical Exam Vitals reviewed.  Constitutional:      General: He is not in acute distress. HENT:     Head: Normocephalic and atraumatic.  Eyes:     General:        Right eye: No discharge.        Left eye: No discharge.  Neck:     Trachea: No tracheal deviation.  Cardiovascular:     Rate and Rhythm: Normal rate and regular rhythm.     Heart sounds: Normal heart sounds. No murmur heard.   Pulmonary:     Effort: Pulmonary effort is normal. No respiratory distress.     Breath sounds: Normal breath sounds.  Lymphadenopathy:     Cervical: No cervical adenopathy.  Skin:    General: Skin is warm and dry.  Neurological:     Mental Status: He is alert.     Coordination: Coordination normal.  Psychiatric:          Behavior: Behavior normal.    Results for orders placed or performed in visit on 06/15/20  Lipid panel  Result Value Ref Range   Cholesterol, Total 171 100 - 199 mg/dL   Triglycerides 182 (H) 0 - 149 mg/dL   HDL 34 (L) >39 mg/dL   VLDL Cholesterol Cal 32 5 - 40 mg/dL   LDL Chol Calc (NIH) 105 (H) 0 - 99 mg/dL   Chol/HDL Ratio 5.0 0.0 - 5.0 ratio  Hepatic function panel  Result Value Ref Range   Total Protein 7.1 6.0 - 8.5 g/dL   Albumin 4.7 3.8 - 4.9 g/dL   Bilirubin Total 0.6 0.0 - 1.2 mg/dL   Bilirubin, Direct 0.15 0.00 - 0.40 mg/dL   Alkaline Phosphatase 81 48 - 121 IU/L   AST 24 0 - 40 IU/L   ALT 25 0 - 44 IU/L  PSA  Result Value Ref Range   Prostate Specific Ag, Serum 0.5 0.0 - 4.0 ng/mL  Basic metabolic panel  Result Value Ref Range   Glucose 95 65 - 99 mg/dL   BUN 17 6 - 24 mg/dL   Creatinine, Ser 1.30 (H) 0.76 - 1.27 mg/dL   GFR calc non Af Amer 60 >59 mL/min/1.73   GFR calc Af Amer 70 >59 mL/min/1.73   BUN/Creatinine Ratio 13 9 - 20   Sodium 143 134 -  144 mmol/L   Potassium 4.5 3.5 - 5.2 mmol/L   Chloride 106 96 - 106 mmol/L   CO2 23 20 - 29 mmol/L   Calcium 9.8 8.7 - 10.2 mg/dL    Patient defers prostate exam until next visit      Assessment & Plan:  HTN- patient seen for follow-up regarding HTN.  Diet, medication compliance, appropriate labs and refills were completed.  Importance of keeping blood pressure under good control to lessen the risk of complications discussed  As for his cholesterol he will restart medication he will start off slowly and build up to every single day he will give [place] feedback on how it is going if he is having any problems or let us know otherwise check lipid liver profile in 3 months  Follow-up by spring  Long discussion held with patient regarding reducing risk of Covid by doing the vaccine

## 2020-07-10 NOTE — Progress Notes (Signed)
What diagnosis for the b12. I could not find anything to use in his chart. Thanks.

## 2020-07-15 NOTE — Progress Notes (Signed)
Lab orders placed and mailed to patient  

## 2020-07-15 NOTE — Addendum Note (Signed)
Addended by: Vicente Males on: 07/15/2020 08:33 AM   Modules accepted: Orders

## 2020-07-19 ENCOUNTER — Other Ambulatory Visit: Payer: Self-pay | Admitting: Family Medicine

## 2020-07-19 NOTE — Telephone Encounter (Signed)
Pt is out of medication and had follow up on 8/25 thought refills was being sent then.

## 2020-09-27 DIAGNOSIS — R5383 Other fatigue: Secondary | ICD-10-CM | POA: Diagnosis not present

## 2020-09-28 LAB — VITAMIN B12: Vitamin B-12: 329 pg/mL (ref 232–1245)

## 2020-10-01 ENCOUNTER — Encounter: Payer: Self-pay | Admitting: Family Medicine

## 2020-10-29 ENCOUNTER — Other Ambulatory Visit: Payer: Self-pay | Admitting: Family Medicine

## 2020-11-22 ENCOUNTER — Other Ambulatory Visit: Payer: Self-pay

## 2020-11-22 ENCOUNTER — Ambulatory Visit: Payer: BC Managed Care – PPO | Admitting: Family Medicine

## 2020-11-22 ENCOUNTER — Encounter: Payer: Self-pay | Admitting: Family Medicine

## 2020-11-22 ENCOUNTER — Ambulatory Visit (HOSPITAL_COMMUNITY)
Admission: RE | Admit: 2020-11-22 | Discharge: 2020-11-22 | Disposition: A | Discharge status: Home / Self Care | Payer: BC Managed Care – PPO | Source: Ambulatory Visit | Attending: Family Medicine | Admitting: Family Medicine

## 2020-11-22 VITALS — BP 130/88 | HR 69 | Temp 98.3°F

## 2020-11-22 DIAGNOSIS — M109 Gout, unspecified: Secondary | ICD-10-CM | POA: Diagnosis not present

## 2020-11-22 DIAGNOSIS — M79672 Pain in left foot: Secondary | ICD-10-CM

## 2020-11-22 DIAGNOSIS — M25572 Pain in left ankle and joints of left foot: Secondary | ICD-10-CM | POA: Insufficient documentation

## 2020-11-22 DIAGNOSIS — M25472 Effusion, left ankle: Secondary | ICD-10-CM | POA: Insufficient documentation

## 2020-11-22 DIAGNOSIS — M7989 Other specified soft tissue disorders: Secondary | ICD-10-CM | POA: Diagnosis not present

## 2020-11-22 MED ORDER — CEPHALEXIN 500 MG PO CAPS: 500.0000 mg | ORAL_CAPSULE | Freq: Two times a day (BID) | ORAL | 0 refills | Status: DC

## 2020-11-22 MED ORDER — NAPROXEN 500 MG PO TABS: 500.0000 mg | ORAL_TABLET | Freq: Two times a day (BID) | ORAL | 0 refills | Status: DC

## 2020-11-22 MED ORDER — COLCHICINE 0.6 MG PO TABS: ORAL_TABLET | ORAL | 0 refills | Status: DC

## 2020-11-22 MED ORDER — CEPHALEXIN 500 MG PO CAPS: 500.0000 mg | ORAL_CAPSULE | Freq: Four times a day (QID) | ORAL | 0 refills | Status: DC

## 2020-11-22 NOTE — Patient Instructions (Signed)
Ankle Pain The ankle joint holds your body weight and allows you to move around. Ankle pain can occur on either side or the back of one ankle or both ankles. Ankle pain may be sharp and burning or dull and aching. There may be tenderness, stiffness, redness, or warmth around the ankle. Many things can cause ankle pain, including an injury to the area and overuse of the ankle. Follow these instructions at home: Activity  Rest your ankle as told by your health care provider. Avoid any activities that cause ankle pain.  Do not use the injured limb to support your body weight until your health care provider says that you can. Use crutches as told by your health care provider.  Do exercises as told by your health care provider.  Ask your health care provider when it is safe to drive if you have a brace on your ankle. If you have a brace:  Wear the brace as told by your health care provider. Remove it only as told by your health care provider.  Loosen the brace if your toes tingle, become numb, or turn cold and blue.  Keep the brace clean.  If the brace is not waterproof: ? Do not let it get wet. ? Cover it with a watertight covering when you take a bath or shower. If you were given an elastic bandage:   Remove it when you take a bath or a shower.  Try not to move your ankle very much, but wiggle your toes from time to time. This helps to prevent swelling.  Adjust the bandage to make it more comfortable if it feels too tight.  Loosen the bandage if you have numbness or tingling in your foot or if your foot turns cold and blue. Managing pain, stiffness, and swelling   If directed, put ice on the painful area. ? If you have a removable brace or elastic bandage, remove it as told by your health care provider. ? Put ice in a plastic bag. ? Place a towel between your skin and the bag. ? Leave the ice on for 20 minutes, 2-3 times a day.  Move your toes often to avoid stiffness and to  lessen swelling.  Raise (elevate) your ankle above the level of your heart while you are sitting or lying down. General instructions  Record information about your pain. Writing down the following may be helpful for you and your health care provider: ? How often you have ankle pain. ? Where the pain is located. ? What the pain feels like.  If treatment involves wearing a prescribed shoe or insole, make sure you wear it correctly and for as long as told by your health care provider.  Take over-the-counter and prescription medicines only as told by your health care provider.  Keep all follow-up visits as told by your health care provider. This is important. Contact a health care provider if:  Your pain gets worse.  Your pain is not relieved with medicines.  You have a fever or chills.  You are having more trouble with walking.  You have new symptoms. Get help right away if:  Your foot, leg, toes, or ankle: ? Tingles or becomes numb. ? Becomes swollen. ? Turns pale or blue. Summary  Ankle pain can occur on either side or the back of one ankle or both ankles.  Ankle pain may be sharp and burning or dull and aching.  Rest your ankle as told by your health care provider.   If told, apply ice to the area.  Take over-the-counter and prescription medicines only as told by your health care provider. This information is not intended to replace advice given to you by your health care provider. Make sure you discuss any questions you have with your health care provider. Document Revised: 02/21/2019 Document Reviewed: 05/11/2018 Elsevier Patient Education  2020 [employer]  

## 2020-11-22 NOTE — Progress Notes (Signed)
Patient ID: DINERO CHAVIRA, male    DOB: 11-26-1960, 60 y.o.   MRN: [id number]   Chief Complaint  Patient presents with  . Foot Pain   Subjective:  CC: left foot and ankle pain and swelling  This is a new problem.  Presents today with a complaint of left foot and ankle pain and swelling.  Denies injury.  Reports this morning he had an episode of nausea and sweating, wife was present took his blood pressure heart rate and blood pressure were good, he recovered within 30 minutes.  Denies fever, chills, chest pain, shortness of breath, abdominal pain.  Left ankle is painful to touch, swollen and erythematous.  Has never had anything like this in the past.  Symptoms have been present for 5 days.  Has tried oxycodone with minimal relief.   left foot pain and swelling for 5 days. Pt states this morning was sweating and had some nausea. Thinks it was from the pain. Did not sleep much last night due to pain.    Medical History [facility] has a past medical history of HTN (hypertension) (08/15/2019) and Medical history non-contributory.   Outpatient Encounter Medications as of 11/22/2020  Medication Sig  . cephALEXin (KEFLEX) 500 MG capsule Take 1 capsule (500 mg total) by mouth 4 (four) times daily.  . colchicine 0.6 MG tablet Take two tablets now by mouth, then one tablet every 12 hours by mouth.  Marland Kitchen lisinopril (ZESTRIL) 10 MG tablet TAKE (1) TABLET BY MOUTH ONCE DAILY.  . naproxen (NAPROSYN) 500 MG tablet Take 1 tablet (500 mg total) by mouth 2 (two) times daily with a meal.  . vitamin B-12 (CYANOCOBALAMIN) 500 MCG tablet Take 500 mcg by mouth daily.  . [DISCONTINUED] cephALEXin (KEFLEX) 500 MG capsule Take 1 capsule (500 mg total) by mouth 2 (two) times daily.  . rosuvastatin (CRESTOR) 10 MG tablet Take 1 tablet (10 mg total) by mouth daily. (Patient not taking: Reported on 07/10/2020)   No facility-administered encounter medications on file as of 11/22/2020.     Review of Systems  Constitutional:  Negative for chills and fever.  HENT: Negative for ear pain.   Respiratory: Negative for shortness of breath.   Cardiovascular: Negative for chest pain.  Gastrointestinal: Negative for abdominal pain.  Musculoskeletal: Positive for joint swelling.       Left ankle erythematous and swelling.  Skin: Positive for color change.       Left foot/ankle erythema.     Vitals BP 130/88   Pulse 69   Temp 98.3 F (36.8 C)   SpO2 96%   Objective:   Physical Exam Vitals reviewed.  Cardiovascular:     Rate and Rhythm: Normal rate and regular rhythm.     Heart sounds: Normal heart sounds.  Pulmonary:     Effort: Pulmonary effort is normal.     Breath sounds: Normal breath sounds.  Musculoskeletal:        General: Swelling and tenderness present. No signs of injury.     Comments: Left ankle.  Skin:    General: Skin is warm and dry.  Neurological:     General: No focal deficit present.     Mental Status: He is alert.  Psychiatric:        Behavior: Behavior normal.      Assessment and Plan   1. Left foot pain - DG Foot Complete Left - DG Ankle Complete Left  2. Pain and swelling of ankle, left - DG Ankle  Complete Left - naproxen (NAPROSYN) 500 MG tablet; Take 1 tablet (500 mg total) by mouth 2 (two) times daily with a meal.  Dispense: 30 tablet; Refill: 0 - cephALEXin (KEFLEX) 500 MG capsule; Take 1 capsule (500 mg total) by mouth 4 (four) times daily.  Dispense: 20 capsule; Refill: 0 - CBC with Differential - Basic Metabolic Panel (BMET)  3. Gout of left ankle, unspecified cause, unspecified chronicity - colchicine 0.6 MG tablet; Take two tablets now by mouth, then one tablet every 12 hours by mouth.  Dispense: 20 tablet; Refill: 0 - Uric acid   Update: Left ankle x-ray results: No acute fracture,osteochondral lesion, possible remote fracture.  Consider MRI for further evaluation. Left foot x-ray results: No evidence of acute fracture.  Patient notified of these results.   Instructed to get lab work done.  Differential diagnosis include: Gout: Will treat with naproxen and colchicine. Uric acid level 7.1, within normal limits (not always elevated during acute event).  Septic arthritis: Erythematous, swollen left lateral ankle.  Keflex increased to 4 times per day: Patient notified of this change. CBC, no evidence of infection, anemia. BMP, kidney function within normal limits. Will review labs in detail at upcoming visit November 25, 2020. Fracture: No evidence of acute fracture of left foot/left ankle.  Possible remote fracture of left ankle.  Consider MRI per radiology suggestion. Nausea, diaphoresis: Wife reports blood pressure and heart rate within normal limits during events.  Unable to perform EKG at the office today, EKG machine upgrade in progress.  Instructed if this should happen again, to report to the emergency department immediately.  Agrees with plan of care discussed today. Understands warning signs to seek further care: Chest pain, shortness of breath, any significant change in health. Understands to follow-up on Monday, November 25, 2020.  Will consider MRI left ankle, as instructed per radiology.   Pecolia Ades, FNP-C 11/24/2020

## 2020-11-23 LAB — CBC WITH DIFFERENTIAL/PLATELET
Basophils Absolute: 0 10*3/uL (ref 0.0–0.2)
Basos: 0 %
EOS (ABSOLUTE): 0.1 10*3/uL (ref 0.0–0.4)
Eos: 1 %
Hematocrit: 43.5 % (ref 37.5–51.0)
Hemoglobin: 15 g/dL (ref 13.0–17.7)
Immature Grans (Abs): 0 10*3/uL (ref 0.0–0.1)
Immature Granulocytes: 0 %
Lymphocytes Absolute: 2 10*3/uL (ref 0.7–3.1)
Lymphs: 22 %
MCH: 31.2 pg (ref 26.6–33.0)
MCHC: 34.5 g/dL (ref 31.5–35.7)
MCV: 90 fL (ref 79–97)
Monocytes Absolute: 0.9 10*3/uL (ref 0.1–0.9)
Monocytes: 10 %
Neutrophils Absolute: 6 10*3/uL (ref 1.4–7.0)
Neutrophils: 67 %
Platelets: 241 10*3/uL (ref 150–450)
RBC: 4.81 x10E6/uL (ref 4.14–5.80)
RDW: 13 % (ref 11.6–15.4)
WBC: 9.1 10*3/uL (ref 3.4–10.8)

## 2020-11-23 LAB — URIC ACID: Uric Acid: 7.1 mg/dL (ref 3.8–8.4)

## 2020-11-23 LAB — BASIC METABOLIC PANEL
BUN/Creatinine Ratio: 14 (ref 9–20)
BUN: 16 mg/dL (ref 6–24)
CO2: 27 mmol/L (ref 20–29)
Calcium: 9.7 mg/dL (ref 8.7–10.2)
Chloride: 102 mmol/L (ref 96–106)
Creatinine, Ser: 1.15 mg/dL (ref 0.76–1.27)
GFR calc Af Amer: 81 mL/min/{1.73_m2} (ref >59)
GFR calc non Af Amer: 70 mL/min/{1.73_m2} (ref >59)
Glucose: 98 mg/dL (ref 65–99)
Potassium: 4.2 mmol/L (ref 3.5–5.2)
Sodium: 142 mmol/L (ref 134–144)

## 2020-11-24 ENCOUNTER — Encounter: Payer: Self-pay | Admitting: Family Medicine

## 2020-11-25 ENCOUNTER — Ambulatory Visit: Payer: BC Managed Care – PPO | Admitting: Family Medicine

## 2020-11-25 ENCOUNTER — Encounter: Payer: Self-pay | Admitting: Family Medicine

## 2020-11-25 ENCOUNTER — Other Ambulatory Visit: Payer: Self-pay

## 2020-11-25 VITALS — BP 122/82 | HR 65 | Temp 98.0°F | Ht 71.0 in | Wt 226.0 lb

## 2020-11-25 DIAGNOSIS — M109 Gout, unspecified: Secondary | ICD-10-CM | POA: Diagnosis not present

## 2020-11-25 DIAGNOSIS — M009 Pyogenic arthritis, unspecified: Secondary | ICD-10-CM | POA: Diagnosis not present

## 2020-11-25 NOTE — Progress Notes (Signed)
Patient ID: Stephen Barnes, male    DOB: 1961-03-17, 60 y.o.   MRN: [id number]   Chief Complaint  Patient presents with  . Ankle Pain    Follow up on left ankle pain- it is doing better today   Subjective:  CC: follow-up left ankle pain and swelling.  This is not a new problem.  Presents today for follow-up for left ankle pain and swelling.  Initially seen on January 7 sent for x-ray, this was negative for fracture.  Started on Keflex, Naprosyn, colchicine.  Presents today for follow-up denies fever, chills, chest pain, shortness of breath.  Reports left ankle is much improved, able to put his shoe on and does not need to use crutches for ambulation today.  Has not had any further episodes of nausea and diaphoresis.    Medical History [facility] has a past medical history of HTN (hypertension) (08/15/2019) and Medical history non-contributory.   Outpatient Encounter Medications as of 11/25/2020  Medication Sig  . cephALEXin (KEFLEX) 500 MG capsule Take 1 capsule (500 mg total) by mouth 4 (four) times daily.  . colchicine 0.6 MG tablet Take two tablets now by mouth, then one tablet every 12 hours by mouth.  Marland Kitchen lisinopril (ZESTRIL) 10 MG tablet TAKE (1) TABLET BY MOUTH ONCE DAILY.  . naproxen (NAPROSYN) 500 MG tablet Take 1 tablet (500 mg total) by mouth 2 (two) times daily with a meal.  . rosuvastatin (CRESTOR) 10 MG tablet Take 1 tablet (10 mg total) by mouth daily. (Patient not taking: Reported on 07/10/2020)  . vitamin B-12 (CYANOCOBALAMIN) 500 MCG tablet Take 500 mcg by mouth daily.   No facility-administered encounter medications on file as of 11/25/2020.     Review of Systems  Constitutional: Negative for chills and fever.  HENT: Negative for ear pain.   Respiratory: Negative for shortness of breath.   Cardiovascular: Negative for chest pain.  Gastrointestinal: Positive for abdominal pain.  Skin:       Slightly swollen, much better. No erythema of left ankle.      Vitals BP  122/82   Pulse 65   Temp 98 F (36.7 C) (Oral)   Ht 5\' 11"  (1.803 m)   Wt 226 lb (102.5 kg)   SpO2 98%   BMI 31.52 kg/m   Objective:   Physical Exam Vitals reviewed.  Cardiovascular:     Rate and Rhythm: Normal rate and regular rhythm.     Heart sounds: Normal heart sounds.  Pulmonary:     Effort: Pulmonary effort is normal.     Breath sounds: Normal breath sounds.  Musculoskeletal:        General: Swelling present. No tenderness or deformity.     Comments: Left ankle slightly swollen, not tender to touch. Much improved.   Skin:    General: Skin is warm and dry.  Neurological:     General: No focal deficit present.     Mental Status: He is alert.  Psychiatric:        Behavior: Behavior normal.      Assessment and Plan   1. Acute gout of left ankle, unspecified cause  2. Septic arthritis of left ankle, due to unspecified organism [facility])    Differential diagnosis continue to be gout versus septic arthritis.  Acute fracture was ruled out.  Left ankle much improved since Friday.  We will continue Keflex 4 times a day due to possibility of septic arthritis.  We will continue naproxen and colchicine due to  possibility of acute gout attack.  Radiologist recommended consider MRI of the left ankle.  Discussion had, wishes to wait to see if all symptoms completely resolved before scheduling MRI.  Agrees with plan of care discussed today. Understands warning signs to seek further care: Chest pain, shortness of breath, any significant change in health.  If erythema returns to left ankle once antibiotics are complete.  Understands to follow-up in 1 month to assist complete resolution of swelling.  Consider MRI at that time.    Chalmers Guest, NP 11/25/2020

## 2020-11-25 NOTE — Progress Notes (Signed)
   Patient ID: Stephen Barnes, male    DOB: 15-Jul-1961, 60 y.o.   MRN: [id number]   Chief Complaint  Patient presents with  . Ankle Pain    Follow up on left ankle pain- it is doing better today   Subjective:  CC: follow-up left ankle  HPI   Medical History [place] has a past medical history of HTN (hypertension) (08/15/2019) and Medical history non-contributory.   Outpatient Encounter Medications as of 11/25/2020  Medication Sig  . cephALEXin (KEFLEX) 500 MG capsule Take 1 capsule (500 mg total) by mouth 4 (four) times daily.  . colchicine 0.6 MG tablet Take two tablets now by mouth, then one tablet every 12 hours by mouth.  Marland Kitchen lisinopril (ZESTRIL) 10 MG tablet TAKE (1) TABLET BY MOUTH ONCE DAILY.  . naproxen (NAPROSYN) 500 MG tablet Take 1 tablet (500 mg total) by mouth 2 (two) times daily with a meal.  . rosuvastatin (CRESTOR) 10 MG tablet Take 1 tablet (10 mg total) by mouth daily. (Patient not taking: Reported on 07/10/2020)  . vitamin B-12 (CYANOCOBALAMIN) 500 MCG tablet Take 500 mcg by mouth daily.   No facility-administered encounter medications on file as of 11/25/2020.     Review of Systems   Vitals BP 122/82   Pulse 65   Temp 98 F (36.7 C) (Oral)   Ht 5\' 11"  (1.803 m)   Wt 226 lb (102.5 kg)   SpO2 98%   BMI 31.52 kg/m   Objective:   Physical Exam   Assessment and Plan   There are no diagnoses linked to this encounter.

## 2020-11-25 NOTE — Patient Instructions (Signed)
Septic Arthritis Septic arthritis is inflammation of a joint that results from an infection. The infection occurs when bacteria or other germs get inside a joint. The knee and hip joints are most often affected, but other joints may also become infected. Usually, just one joint is affected. Joint infections need to be treated quickly to prevent damage to the joint, and to prevent the infection from spreading to other areas of your body. What are the causes? This condition is most often caused by Staphylococcus bacteria. Other causes may include:  Fungal infections.  Sexually transmitted infections (STIs). Tuberculosis. Gout  Gout is painful swelling of your joints. Gout is a type of arthritis. It is caused by having too much uric acid in your body. Uric acid is a chemical that is made when your body breaks down substances called purines. If your body has too much uric acid, sharp crystals can form and build up in your joints. This causes pain and swelling. Gout attacks can happen quickly and be very painful (acute gout). Over time, the attacks can affect more joints and happen more often (chronic gout). What are the causes? Too much uric acid in your blood. This can happen because: Your kidneys do not remove enough uric acid from your blood. Your body makes too much uric acid. You eat too many foods that are high in purines. These foods include organ meats, some seafood, and beer. Trauma or stress. What increases the risk? Having a family history of gout. Being male and middle-aged. Being male and having gone through menopause. Being very overweight (obese). Drinking alcohol, especially beer. Not having enough water in the body (being dehydrated). Losing weight too quickly. Having an organ transplant. Having lead poisoning. Taking certain medicines. Having kidney disease. Having a skin condition called psoriasis. What are the signs or symptoms? An attack of acute gout usually  happens in just one joint. The most common place is the big toe. Attacks often start at night. Other joints that may be affected include joints of the feet, ankle, knee, fingers, wrist, or elbow. Symptoms of an attack may include: Very bad pain. Warmth. Swelling. Stiffness. Shiny, red, or purple skin. Tenderness. The affected joint may be very painful to touch. Chills and fever. Chronic gout may cause symptoms more often. More joints may be involved. You may also have white or yellow lumps (tophi) on your hands or feet or in other areas near your joints.   How is this treated? Treatment for this condition has two phases: treating an acute attack and preventing future attacks. Acute gout treatment may include: NSAIDs. Steroids. These are taken by mouth or injected into a joint. Colchicine. This medicine relieves pain and swelling. It can be given by mouth or through an IV tube. Preventive treatment may include: Taking small doses of NSAIDs or colchicine daily. Using a medicine that reduces uric acid levels in your blood. Making changes to your diet. You may need to see a food expert (dietitian) about what to eat and drink to prevent gout. Follow these instructions at home: During a gout attack If told, put ice on the painful area: Put ice in a plastic bag. Place a towel between your skin and the bag. Leave the ice on for 20 minutes, 2-3 times a day. Raise (elevate) the painful joint above the level of your heart as often as you can. Rest the joint as much as possible. If the joint is in your leg, you may be given crutches. Follow instructions  from your doctor about what you cannot eat or drink.   Avoiding future gout attacks Eat a low-purine diet. Avoid foods and drinks such as: Liver. Kidney. Anchovies. Asparagus. Herring. Mushrooms. Mussels. Beer. Stay at a healthy weight. If you want to lose weight, talk with your doctor. Do not lose weight too fast. Start or continue an  exercise plan as told by your doctor. Eating and drinking Drink enough fluids to keep your pee (urine) pale yellow. If you drink alcohol: Limit how much you use to: 0-1 drink a day for women. 0-2 drinks a day for men. Be aware of how much alcohol is in your drink. In the [place] one drink equals one 12 oz bottle of beer (355 mL), one 5 oz glass of wine (148 mL), or one 1 oz glass of hard liquor (44 mL). General instructions Take over-the-counter and prescription medicines only as told by your doctor. Do not drive or use heavy machinery while taking prescription pain medicine. Return to your normal activities as told by your doctor. Ask your doctor what activities are safe for you. Keep all follow-up visits as told by your doctor. This is important. Contact a doctor if: You have another gout attack. You still have symptoms of a gout attack after 10 days of treatment. You have problems (side effects) because of your medicines. You have chills or a fever. You have burning pain when you pee (urinate). You have pain in your lower back or belly. Get help right away if: You have very bad pain. Your pain cannot be controlled. You cannot pee. Summary Gout is painful swelling of the joints. The most common site of pain is the big toe, but it can affect other joints. Medicines and avoiding some foods can help to prevent and treat gout attacks. This information is not intended to replace advice given to you by your health care provider. Make sure you discuss any questions you have with your health care provider. Document Revised: 05/25/2018 Document Reviewed: 05/25/2018 Elsevier Patient Education  2021 [place] or other germs can spread to the joint. These bacteria are usually from:  Blood carrying germs from an infection in another part of your body to your joint. This is the most common cause of septic arthritis.  An open wound near the joint.  A needle put into the  joint.  Joint surgery.  An infection in the bone (osteomyelitis) that spreads to the joint. What increases the risk? You may have a higher risk for this condition if you:  Have an artificial joint.  Have a blood or skin infection.  Have open sores or wounds on your skin.  Had a recent joint surgery or procedure.  Had a recent joint injury.  Have a long-term (chronic) disease, such as: ? Diabetes. ? Osteoarthritis. ? Rheumatoid arthritis. ? HIV (human immunodeficiency virus).  Have a condition or take medicines that weaken your body's defense system (immune system).  Use IV medicines.  Have gonorrhea.  Have a central line for IV access. What are the signs or symptoms? Symptoms of this condition include:  Swelling at the joint.  Severe pain in the joint.  Redness and warmth in the joint.  Being unable to move the joint.  Fever and chills. How is this diagnosed? This condition may be diagnosed based on:  Your symptoms.  Your medical history.  A physical exam.  Other tests to confirm the diagnosis. These may include: ? Removing fluid from your joint  to look for signs of infection (synovial fluid analysis). ? Blood tests.  Imaging studies. These may include: ? X-rays. ? MRI. ? CT scan. ? Ultrasound. How is this treated? This condition may be treated by:  Draining fluid from your joint. This may be done for several days in order to relieve pain.  Taking antibiotic medicine. This may be given by IV or by mouth. It may be done in a hospital at first. You may have to continue antibiotics at home by IV or by mouth for several weeks after that.  Surgery to remove: ? Infected fluid and tissue from the joint. ? An infected artificial joint. After the infection has started to heal, you may need physical therapy to regain strength and motion of the joint. Follow these instructions at home: Medicines  Take over-the-counter and prescription medicines only as  told by your health care provider.  If you were prescribed an antibiotic medicine, take it as told by your health care provider. Do not stop taking the antibiotic even if you start to feel better.  Follow instructions from your health care provider about how to take antibiotics at home by IV. You may need to have a nurse come to your home to give you antibiotics through IV.   Managing pain, stiffness, and swelling  If directed, put ice on the affected area: ? Put ice in a plastic bag. ? Place a towel between your skin and the bag. ? Leave the ice on for 20 minutes, 2-3 times a day.  Raise (elevate) the affected area above the level of your heart while you are sitting or lying down.   Activity  Return to your normal activities as told by your health care provider. Ask your health care provider what activities are safe for you.  Do any exercises or stretches as told by your health care provider or physical therapist. General instructions  Do not use any products that contain nicotine or tobacco, such as cigarettes and e-cigarettes. These can delay healing. If you need help quitting, ask your health care provider.  Wash your hands often with soap and water. If soap and water are not available, use hand sanitizer.  Keep all follow-up visits as told by your health care provider. This is important. Contact a health care provider if you:  Have pain that is not controlled with medicine.  Develop a fever or chills.  Have redness, warmth, pain, or swelling that returns after treatment. Get help right away if you:  Have signs of worsening infection in your joint. Watch for: ? Very severe pain. ? Redness. ? Warmth. ? Swelling.  Have rapid breathing or you have trouble breathing.  Have chest pain.  Cannot drink fluids or make urine.  Notice that the affected area changed color or turned blue.  Have numbness or severe pain in the affected area. Summary  Septic arthritis is  inflammation of a joint that occurs when bacteria or other germs get inside a joint and cause an infection.  Joint infections need to be treated quickly to prevent damage to the joint, and to prevent the infection from spreading to other areas of your body.  Symptoms of joint infection include redness, warmth, swelling, pain, and being unable to move a joint.  Treatment usually involves draining fluid from the joint and taking antibiotic medicine. This information is not intended to replace advice given to you by your health care provider. Make sure you discuss any questions you have with your health  care provider. Document Revised: 09/12/2019 Document Reviewed: 12/14/2017 Elsevier Patient Education  2021 [employer].

## 2020-11-29 ENCOUNTER — Ambulatory Visit: Payer: BC Managed Care – PPO | Admitting: Family Medicine

## 2021-01-07 ENCOUNTER — Encounter: Payer: Self-pay | Admitting: Family Medicine

## 2021-01-07 ENCOUNTER — Ambulatory Visit: Payer: BC Managed Care – PPO | Admitting: Family Medicine

## 2021-01-07 ENCOUNTER — Other Ambulatory Visit: Payer: Self-pay

## 2021-01-07 VITALS — BP 136/78 | HR 76 | Temp 95.8°F | Wt 227.6 lb

## 2021-01-07 DIAGNOSIS — I10 Essential (primary) hypertension: Secondary | ICD-10-CM | POA: Diagnosis not present

## 2021-01-07 DIAGNOSIS — M109 Gout, unspecified: Secondary | ICD-10-CM | POA: Diagnosis not present

## 2021-01-07 MED ORDER — LISINOPRIL 10 MG PO TABS: ORAL_TABLET | ORAL | 1 refills | Status: DC

## 2021-01-07 MED ORDER — COLCHICINE 0.6 MG PO TABS: ORAL_TABLET | ORAL | 0 refills | Status: DC

## 2021-01-07 NOTE — Patient Instructions (Signed)
Gout  Gout is painful swelling of your joints. Gout is a type of arthritis. It is caused by having too much uric acid in your body. Uric acid is a chemical that is made when your body breaks down substances called purines. If your body has too much uric acid, sharp crystals can form and build up in your joints. This causes pain and swelling. Gout attacks can happen quickly and be very painful (acute gout). Over time, the attacks can affect more joints and happen more often (chronic gout). What are the causes?  Too much uric acid in your blood. This can happen because: ? Your kidneys do not remove enough uric acid from your blood. ? Your body makes too much uric acid. ? You eat too many foods that are high in purines. These foods include organ meats, some seafood, and beer.  Trauma or stress. What increases the risk?  Having a family history of gout.  Being male and middle-aged.  Being male and having gone through menopause.  Being very overweight (obese).  Drinking alcohol, especially beer.  Not having enough water in the body (being dehydrated).  Losing weight too quickly.  Having an organ transplant.  Having lead poisoning.  Taking certain medicines.  Having kidney disease.  Having a skin condition called psoriasis. What are the signs or symptoms? An attack of acute gout usually happens in just one joint. The most common place is the big toe. Attacks often start at night. Other joints that may be affected include joints of the feet, ankle, knee, fingers, wrist, or elbow. Symptoms of an attack may include:  Very bad pain.  Warmth.  Swelling.  Stiffness.  Shiny, red, or purple skin.  Tenderness. The affected joint may be very painful to touch.  Chills and fever. Chronic gout may cause symptoms more often. More joints may be involved. You may also have white or yellow lumps (tophi) on your hands or feet or in other areas near your joints.   How is this  treated?  Treatment for this condition has two phases: treating an acute attack and preventing future attacks.  Acute gout treatment may include: ? NSAIDs. ? Steroids. These are taken by mouth or injected into a joint. ? Colchicine. This medicine relieves pain and swelling. It can be given by mouth or through an IV tube.  Preventive treatment may include: ? Taking small doses of NSAIDs or colchicine daily. ? Using a medicine that reduces uric acid levels in your blood. ? Making changes to your diet. You may need to see a food expert (dietitian) about what to eat and drink to prevent gout. Follow these instructions at home: During a gout attack  If told, put ice on the painful area: ? Put ice in a plastic bag. ? Place a towel between your skin and the bag. ? Leave the ice on for 20 minutes, 2-3 times a day.  Raise (elevate) the painful joint above the level of your heart as often as you can.  Rest the joint as much as possible. If the joint is in your leg, you may be given crutches.  Follow instructions from your doctor about what you cannot eat or drink.   Avoiding future gout attacks  Eat a low-purine diet. Avoid foods and drinks such as: ? Liver. ? Kidney. ? Anchovies. ? Asparagus. ? Herring. ? Mushrooms. ? Mussels. ? Beer.  Stay at a healthy weight. If you want to lose weight, talk with your doctor. Do   not lose weight too fast.  Start or continue an exercise plan as told by your doctor. Eating and drinking  Drink enough fluids to keep your pee (urine) pale yellow.  If you drink alcohol: ? Limit how much you use to:  0-1 drink a day for women.  0-2 drinks a day for men. ? Be aware of how much alcohol is in your drink. In the [place] one drink equals one 12 oz bottle of beer (355 mL), one 5 oz glass of wine (148 mL), or one 1 oz glass of hard liquor (44 mL). General instructions  Take over-the-counter and prescription medicines only as told by your doctor.  Do  not drive or use heavy machinery while taking prescription pain medicine.  Return to your normal activities as told by your doctor. Ask your doctor what activities are safe for you.  Keep all follow-up visits as told by your doctor. This is important. Contact a doctor if:  You have another gout attack.  You still have symptoms of a gout attack after 10 days of treatment.  You have problems (side effects) because of your medicines.  You have chills or a fever.  You have burning pain when you pee (urinate).  You have pain in your lower back or belly. Get help right away if:  You have very bad pain.  Your pain cannot be controlled.  You cannot pee. Summary  Gout is painful swelling of the joints.  The most common site of pain is the big toe, but it can affect other joints.  Medicines and avoiding some foods can help to prevent and treat gout attacks. This information is not intended to replace advice given to you by your health care provider. Make sure you discuss any questions you have with your health care provider. Document Revised: 05/25/2018 Document Reviewed: 05/25/2018 Elsevier Patient Education  2021 [place] A low-purine eating plan involves making food choices to limit your intake of purine. Purine is a kind of uric acid. Too much uric acid in your blood can cause certain conditions, such as gout and kidney stones. Eating a low-purine diet can help control these conditions. What are tips for following this plan? Reading food labels  Avoid foods with saturated or Trans fat.  Check the ingredient list of grains-based foods, such as bread and cereal, to make sure that they contain whole grains.  Check the ingredient list of sauces or soups to make sure they do not contain meat or fish.  When choosing soft drinks, check the ingredient list to make sure they do not contain high-fructose corn syrup. Shopping  Buy plenty of fresh fruits  and vegetables.  Avoid buying canned or fresh fish.  Buy dairy products labeled as low-fat or nonfat.  Avoid buying premade or processed foods. These foods are often high in fat, salt (sodium), and added sugar.   Cooking  Use olive oil instead of butter when cooking. Oils like olive oil, canola oil, and sunflower oil contain healthy fats. Meal planning  Learn which foods do or do not affect you. If you find out that a food tends to cause your gout symptoms to flare up, avoid eating that food. You can enjoy foods that do not cause problems. If you have any questions about a food item, talk with your dietitian or health care provider.  Limit foods high in fat, especially saturated fat. Fat makes it harder for your body to get rid of uric acid.  Choose foods that are lower in fat and are lean sources of protein. General guidelines  Limit alcohol intake to no more than 1 drink a day for nonpregnant women and 2 drinks a day for men. One drink equals 12 oz of beer, 5 oz of wine, or 1 oz of hard liquor. Alcohol can affect the way your body gets rid of uric acid.  Drink plenty of water to keep your urine clear or pale yellow. Fluids can help remove uric acid from your body.  If directed by your health care provider, take a vitamin C supplement.  Work with your health care provider and dietitian to develop a plan to achieve or maintain a healthy weight. Losing weight can help reduce uric acid in your blood. What foods are recommended? The items listed may not be a complete list. Talk with your dietitian about what dietary choices are best for you. Foods low in purines Foods low in purines do not need to be limited. These include:  All fruits.  All low-purine vegetables, pickles, and olives.  Breads, pasta, rice, cornbread, and popcorn. Cake and other baked goods.  All dairy foods.  Eggs, nuts, and nut butters.  Spices and condiments, such as salt, herbs, and vinegar.  Plant oils,  butter, and margarine.  Water, sugar-free soft drinks, tea, coffee, and cocoa.  Vegetable-based soups, broths, sauces, and gravies. Foods moderate in purines Foods moderate in purines should be limited to the amounts listed.   cup of asparagus, cauliflower, spinach, mushrooms, or green peas, each day.  2/3 cup uncooked oatmeal, each day.   cup dry wheat bran or wheat germ, each day.  2-3 ounces of meat or poultry, each day.  4-6 ounces of shellfish, such as crab, lobster, oysters, or shrimp, each day.  1 cup cooked beans, peas, or lentils, each day.  Soup, broths, or bouillon made from meat or fish. Limit these foods as much as possible. What foods are not recommended? The items listed may not be a complete list. Talk with your dietitian about what dietary choices are best for you. Limit your intake of foods high in purines, including:  Beer and other alcohol.  Meat-based gravy or sauce.  Canned or fresh fish, such as: ? Anchovies, sardines, herring, and tuna. ? Mussels and scallops. ? Codfish, trout, and haddock.  Berniece Salines.  Organ meats, such as: ? Liver or kidney. ? Tripe. ? Sweetbreads (thymus gland or pancreas).  Wild Clinical biochemist.  Yeast or yeast extract supplements.  Drinks sweetened with high-fructose corn syrup. Summary  Eating a low-purine diet can help control conditions caused by too much uric acid in the body, such as gout or kidney stones.  Choose low-purine foods, limit alcohol, and limit foods high in fat.  You will learn over time which foods do or do not affect you. If you find out that a food tends to cause your gout symptoms to flare up, avoid eating that food. This information is not intended to replace advice given to you by your health care provider. Make sure you discuss any questions you have with your health care provider. Document Revised: 02/15/2020 Document Reviewed: 02/15/2020 Elsevier Patient Education  2021 [place] A low-purine eating plan involves making food choices to limit your intake of purine. Purine is a kind of uric acid. Too much uric acid in your blood can cause certain conditions, such as gout and kidney stones. Eating a low-purine diet can help control these  conditions. What are tips for following this plan? Reading food labels  Avoid foods with saturated or Trans fat.  Check the ingredient list of grains-based foods, such as bread and cereal, to make sure that they contain whole grains.  Check the ingredient list of sauces or soups to make sure they do not contain meat or fish.  When choosing soft drinks, check the ingredient list to make sure they do not contain high-fructose corn syrup. Shopping  Buy plenty of fresh fruits and vegetables.  Avoid buying canned or fresh fish.  Buy dairy products labeled as low-fat or nonfat.  Avoid buying premade or processed foods. These foods are often high in fat, salt (sodium), and added sugar.   Cooking  Use olive oil instead of butter when cooking. Oils like olive oil, canola oil, and sunflower oil contain healthy fats. Meal planning  Learn which foods do or do not affect you. If you find out that a food tends to cause your gout symptoms to flare up, avoid eating that food. You can enjoy foods that do not cause problems. If you have any questions about a food item, talk with your dietitian or health care provider.  Limit foods high in fat, especially saturated fat. Fat makes it harder for your body to get rid of uric acid.  Choose foods that are lower in fat and are lean sources of protein. General guidelines  Limit alcohol intake to no more than 1 drink a day for nonpregnant women and 2 drinks a day for men. One drink equals 12 oz of beer, 5 oz of wine, or 1 oz of hard liquor. Alcohol can affect the way your body gets rid of uric acid.  Drink plenty of water to keep your urine clear or pale yellow. Fluids can  help remove uric acid from your body.  If directed by your health care provider, take a vitamin C supplement.  Work with your health care provider and dietitian to develop a plan to achieve or maintain a healthy weight. Losing weight can help reduce uric acid in your blood. What foods are recommended? The items listed may not be a complete list. Talk with your dietitian about what dietary choices are best for you. Foods low in purines Foods low in purines do not need to be limited. These include:  All fruits.  All low-purine vegetables, pickles, and olives.  Breads, pasta, rice, cornbread, and popcorn. Cake and other baked goods.  All dairy foods.  Eggs, nuts, and nut butters.  Spices and condiments, such as salt, herbs, and vinegar.  Plant oils, butter, and margarine.  Water, sugar-free soft drinks, tea, coffee, and cocoa.  Vegetable-based soups, broths, sauces, and gravies. Foods moderate in purines Foods moderate in purines should be limited to the amounts listed.   cup of asparagus, cauliflower, spinach, mushrooms, or green peas, each day.  2/3 cup uncooked oatmeal, each day.   cup dry wheat bran or wheat germ, each day.  2-3 ounces of meat or poultry, each day.  4-6 ounces of shellfish, such as crab, lobster, oysters, or shrimp, each day.  1 cup cooked beans, peas, or lentils, each day.  Soup, broths, or bouillon made from meat or fish. Limit these foods as much as possible. What foods are not recommended? The items listed may not be a complete list. Talk with your dietitian about what dietary choices are best for you. Limit your intake of foods high in purines, including:  Beer and other alcohol.  Meat-based gravy or sauce.  Canned or fresh fish, such as: ? Anchovies, sardines, herring, and tuna. ? Mussels and scallops. ? Codfish, trout, and haddock.  Berniece Salines.  Organ meats, such as: ? Liver or kidney. ? Tripe. ? Sweetbreads (thymus gland or  pancreas).  Wild Clinical biochemist.  Yeast or yeast extract supplements.  Drinks sweetened with high-fructose corn syrup. Summary  Eating a low-purine diet can help control conditions caused by too much uric acid in the body, such as gout or kidney stones.  Choose low-purine foods, limit alcohol, and limit foods high in fat.  You will learn over time which foods do or do not affect you. If you find out that a food tends to cause your gout symptoms to flare up, avoid eating that food. This information is not intended to replace advice given to you by your health care provider. Make sure you discuss any questions you have with your health care provider. Document Revised: 02/15/2020 Document Reviewed: 02/15/2020 Elsevier Patient Education  2021 [employer].

## 2021-01-07 NOTE — Progress Notes (Signed)
   Subjective:    Patient ID: Stephen Barnes, male    DOB: Aug 20, 1961, 60 y.o.   MRN: [id number]  HPI Pt here for 6 month follow up on cholesterol, HTN and to recheck left ankle. Pt did not take Crestor; instead he has changed his diet. No issues with blood pressure. Taking Lisinopril 10 mg once a day/ Patient did not want to take cholesterol medicine.  He understands that this does reduce his risk of heart disease but he states he would prefer to try dietary measures alone.  He denies any major setbacks currently he is staying physically active breathing going good no chest pressure pain or discomfort Pt was prescribed antibiotics for ankle and that has helped.  Based upon what the patient stated I believe more than likely he had problems with gout and I would recommend low purine diet.  We will do lab work before his follow-up visit in August Review of Systems  Constitutional: Negative for activity change, fatigue and fever.  HENT: Negative for congestion and rhinorrhea.   Respiratory: Negative for cough and shortness of breath.   Cardiovascular: Negative for chest pain and leg swelling.  Gastrointestinal: Negative for abdominal pain, diarrhea and nausea.  Genitourinary: Negative for dysuria and hematuria.  Neurological: Negative for weakness and headaches.  Psychiatric/Behavioral: Negative for agitation and behavioral problems.       Objective:    Lungs clear heart regular pulse normal BP good      Assessment & Plan:  HTN good control continue current medication watch diet stay active  Wellness checkup in August with lab work  Probable gout low purine diet colchicine if necessary if severe trouble occurs again follow-up

## 2021-03-12 ENCOUNTER — Encounter (INDEPENDENT_AMBULATORY_CARE_PROVIDER_SITE_OTHER): Payer: Self-pay | Admitting: *Deleted

## 2021-08-27 ENCOUNTER — Emergency Department (HOSPITAL_COMMUNITY)
Admission: EM | Admit: 2021-08-27 | Discharge: 2021-08-27 | Disposition: A | Discharge status: Home / Self Care | Payer: BC Managed Care – PPO | Attending: Emergency Medicine | Admitting: Emergency Medicine

## 2021-08-27 ENCOUNTER — Encounter (HOSPITAL_COMMUNITY): Payer: Self-pay | Admitting: *Deleted

## 2021-08-27 ENCOUNTER — Other Ambulatory Visit: Payer: Self-pay

## 2021-08-27 ENCOUNTER — Emergency Department (HOSPITAL_COMMUNITY): Payer: BC Managed Care – PPO

## 2021-08-27 DIAGNOSIS — Z79899 Other long term (current) drug therapy: Secondary | ICD-10-CM | POA: Insufficient documentation

## 2021-08-27 DIAGNOSIS — Z23 Encounter for immunization: Secondary | ICD-10-CM | POA: Insufficient documentation

## 2021-08-27 DIAGNOSIS — I10 Essential (primary) hypertension: Secondary | ICD-10-CM | POA: Insufficient documentation

## 2021-08-27 DIAGNOSIS — S61213A Laceration without foreign body of left middle finger without damage to nail, initial encounter: Secondary | ICD-10-CM | POA: Insufficient documentation

## 2021-08-27 DIAGNOSIS — Z87891 Personal history of nicotine dependence: Secondary | ICD-10-CM | POA: Insufficient documentation

## 2021-08-27 DIAGNOSIS — W268XXA Contact with other sharp object(s), not elsewhere classified, initial encounter: Secondary | ICD-10-CM | POA: Insufficient documentation

## 2021-08-27 DIAGNOSIS — S6992XA Unspecified injury of left wrist, hand and finger(s), initial encounter: Secondary | ICD-10-CM | POA: Diagnosis not present

## 2021-08-27 DIAGNOSIS — S61313A Laceration without foreign body of left middle finger with damage to nail, initial encounter: Secondary | ICD-10-CM

## 2021-08-27 DIAGNOSIS — Y990 Civilian activity done for income or pay: Secondary | ICD-10-CM | POA: Diagnosis not present

## 2021-08-27 MED ORDER — TETANUS-DIPHTH-ACELL PERTUSSIS 5-2.5-18.5 LF-MCG/0.5 IM SUSY
0.5000 mL | PREFILLED_SYRINGE | Freq: Once | INTRAMUSCULAR | Status: AC
  Administered 2021-08-27: 0.5 mL via INTRAMUSCULAR
  Filled 2021-08-27: qty 0.5

## 2021-08-27 MED ORDER — OXYCODONE-ACETAMINOPHEN 5-325 MG PO TABS: 1.0000 | ORAL_TABLET | Freq: Three times a day (TID) | ORAL | 0 refills | Status: AC | PRN

## 2021-08-27 MED ORDER — CEPHALEXIN 500 MG PO CAPS: 500.0000 mg | ORAL_CAPSULE | Freq: Four times a day (QID) | ORAL | 0 refills | Status: AC

## 2021-08-27 NOTE — Discharge Instructions (Addendum)
You received a total of 6 sutures in your finger, I placed you in an splint please leave on for today.  Starting tomorrow I would like you to change out the dressings and rinse out the wound with clean water 2 times daily.  I have started you on antibiotics please take as prescribed.I have given you a short course of narcotics please take as prescribed.  This medication can make you drowsy do not consume alcohol or operate heavy machinery when taking this medication.  This medication is Tylenol in it do not take Tylenol and take this medication.     Please follow-up with Dr. Apolonio Schneiders next week for reevaluation given his contact number please call to schedule a follow-up appointment.  Come back to the emergency department if you develop chest pain, shortness of breath, severe abdominal pain, uncontrolled nausea, vomiting, diarrhea.

## 2021-08-27 NOTE — ED Provider Notes (Signed)
[facility] EMERGENCY DEPARTMENT Provider Note   CSN: [id number] Arrival date & time: 08/27/21  1150     History Chief Complaint  Patient presents with   Finger Injury    TOA MIA is a 60 y.o. male.  HPI  Patient who is left-hand dominant without significant medical history presents with chief complaint of laceration to his left middle finger.  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  He states that he was able control the bleeding with direct pressure.  He states he still has full sensation in his fingers able to move all joints.  States that he is he has a large laceration is concerned about it.  He is not immunocompromise, does not remember the last time he had a tetanus shot, he denies  alleviating or aggravating factors.  He does not endorse fevers, chills, chest pain, short of breath, worsening pedal edema.  Past Medical History:  Diagnosis Date   HTN (hypertension) 08/15/2019   Medical history non-contributory     Patient Active Problem List   Diagnosis Date Noted   Septic arthritis of left ankle ([place]) 11/25/2020   Left foot pain 11/22/2020   Pain and swelling of ankle, left 11/22/2020   Acute gout of left ankle 11/22/2020   HTN (hypertension) 08/15/2019   Osteoarthritis of knee 05/05/2018    Past Surgical History:  Procedure Laterality Date   CATARACT EXTRACTION W/PHACO Right 06/23/2019   Procedure: CATARACT EXTRACTION PHACO AND INTRAOCULAR LENS PLACEMENT ([place]);  Surgeon: Baruch Goldmann, MD;  Location: [facility] ORS;  Service: Ophthalmology;  Laterality: Right;  CDE: 4.04   COLONOSCOPY N/A 04/01/2016   Procedure: COLONOSCOPY;  Surgeon: Rogene Houston, MD;  Location: [facility] ENDO SUITE;  Service: Endoscopy;  Laterality: N/A;  830   NO PAST SURGERIES         History reviewed. No pertinent family history.  Social History   Tobacco Use   Smoking status: Former   Smokeless tobacco: Never   Substance Use Topics   Alcohol use: No   Drug use: No    Home Medications Prior to Admission medications   Medication Sig Start Date End Date Taking? Authorizing Provider  cephALEXin (KEFLEX) 500 MG capsule Take 1 capsule (500 mg total) by mouth 4 (four) times daily for 7 days. 08/27/21 09/03/21 Yes Marcello Fennel, PA-C  oxyCODONE-acetaminophen (PERCOCET/ROXICET) 5-325 MG tablet Take 1 tablet by mouth every 8 (eight) hours as needed for up to 3 days for severe pain. 08/27/21 08/30/21 Yes Marcello Fennel, PA-C  colchicine 0.6 MG tablet Take two tablets now by mouth, then one tablet every 12 hours by mouth. 01/07/21   Kathyrn Drown, MD  lisinopril (ZESTRIL) 10 MG tablet 1 qd 01/07/21   Kathyrn Drown, MD  vitamin B-12 (CYANOCOBALAMIN) 500 MCG tablet Take 500 mcg by mouth daily.    [provider]    Allergies    Patient has no known allergies.  Review of Systems   Review of Systems  Constitutional:  Negative for chills and fever.  HENT:  Negative for congestion.   Respiratory:  Negative for shortness of breath.   Cardiovascular:  Negative for chest pain.  Gastrointestinal:  Negative for abdominal pain.  Genitourinary:  Negative for enuresis.  Musculoskeletal:  Negative for back pain.  Skin:  Positive for wound. Negative for rash.  Neurological:  Negative for dizziness.  Hematological:  Does not bruise/bleed easily.  Physical Exam Updated Vital Signs BP 126/78 (BP Location: Right Arm)   Pulse 68   Temp 98 F (36.7 C) (Oral)   Resp 16   Ht 5\' 11"  (1.803 m)   Wt 96.2 kg   SpO2 98%   BMI 29.57 kg/m   Physical Exam Vitals and nursing note reviewed.  Constitutional:      General: He is not in acute distress.    Appearance: He is not ill-appearing.  HENT:     Head: Normocephalic and atraumatic.     Nose: No congestion.  Eyes:     Conjunctiva/sclera: Conjunctivae normal.  Cardiovascular:     Rate and Rhythm: Normal rate and regular rhythm.      Pulses: Normal pulses.  Pulmonary:     Effort: Pulmonary effort is normal.  Musculoskeletal:     Comments: Patient has a oblique laceration that runs through the nailbed on the left middle finger, wound measured approximately 4 cm in length, about 2 mm in depth, no noted ligament or tendon damage present, no foreign body noted.  He has full range of motion of all joints, neurovascular fully intact.  Please see picture for full detail.  Skin:    General: Skin is warm and dry.  Neurological:     Mental Status: He is alert.  Psychiatric:        Mood and Affect: Mood normal.           ED Results / Procedures / Treatments   Labs (all labs ordered are listed, but only abnormal results are displayed) Labs Reviewed - No data to display  EKG None  Radiology DG Finger Middle Left  Result Date: 08/27/2021 CLINICAL DATA:  Distal injury with laceration EXAM: LEFT MIDDLE FINGER 2+V COMPARISON:  05/28/2017 hand radiographs FINDINGS: Remote fixation of the second distal metacarpal. Soft tissue injury involving the tuft of the third digit. No acute fracture or dislocation. No radiopaque foreign object. IMPRESSION: No acute osseous abnormality. Electronically Signed   By: Abigail Miyamoto M.D.   On: 08/27/2021 12:53    Procedures .Marland KitchenLaceration Repair  Date/Time: 08/27/2021 4:02 PM Performed by: Marcello Fennel, PA-C Authorized by: Marcello Fennel, PA-C   Consent:    Consent obtained:  Verbal   Consent given by:  Patient   Risks discussed:  Infection, pain, retained foreign body, need for additional repair, poor cosmetic result, tendon damage, vascular damage, poor wound healing and nerve damage   Alternatives discussed:  No treatment, delayed treatment, observation and referral Universal protocol:    Patient identity confirmed:  Verbally with patient Anesthesia:    Anesthesia method:  Nerve block   Block needle gauge:  24 G   Block anesthetic:  Lidocaine 1% WITH epi   Block  injection procedure:  Introduced needle   Block outcome:  Anesthesia achieved Laceration details:    Location:  Finger   Finger location:  L long finger   Length (cm):  4   Depth (mm):  2 Pre-procedure details:    Preparation:  Patient was prepped and draped in usual sterile fashion and imaging obtained to evaluate for foreign bodies Exploration:    Limited defect created (wound extended): no     Hemostasis achieved with:  Direct pressure   Imaging obtained: x-ray     Imaging outcome: foreign body not noted     Wound exploration: wound explored through full range of motion and entire depth of wound visualized     Contaminated: no   Treatment:  Area cleansed with:  Betadine and saline   Amount of cleaning:  Extensive   Irrigation solution:  Sterile water   Irrigation method:  Pressure wash   Visualized foreign bodies/material removed: no     Debridement:  None Skin repair:    Repair method:  Sutures   Suture size:  4-0   Suture material:  Prolene   Suture technique:  Simple interrupted   Number of sutures:  6 Approximation:    Approximation:  Loose Repair type:    Repair type:  Intermediate Post-procedure details:    Dressing:  Non-adherent dressing and splint for protection   Procedure completion:  Tolerated well, no immediate complications   Medications Ordered in ED Medications  Tdap (BOOSTRIX) injection 0.5 mL (0.5 mLs Intramuscular Given 08/27/21 1401)    ED Course  I have reviewed the triage vital signs and the nursing notes.  Pertinent labs & imaging results that were available during my care of the patient were reviewed by me and considered in my medical decision making (see chart for details).    MDM Rules/Calculators/A&P                          Initial impression-patient presents with a laceration to his left middle finger.  He is alert, does not appear in acute stress, vital signs are reassuring.  Concern for possible fracture will obtain imaging for  further evaluation, update patient's tetanus shot.  Work-up-DG of left middle finger negative for acute findings.  Reassessment-due to extent of laceration will recommend digital block for improved pain management, will also speak with hand surgery as he has nailbed involvement  Updated patient on recommendation from orthopedic surgery he is agreement this plan and tolerated the procedure well. He received 6 sutures.  Neurovascular was fully intact after the procedure.   Consult-spoke with Dr. Apolonio Schneiders who recommends tacking the wound and cleaning up thoroughly and he will see him in his office next week.  Rule out-Low suspicion for fracture or dislocation as x-ray does not reveal any acute findings. low suspicion for ligament or tendon damage as area was palpated no gross defects noted, he had full range of motion of all joints of his left middle finger.  Low suspicion for compartment syndrome as area was palpated it was soft to the touch, neurovascular fully intact before and after the procedure  Plan-  Laceration-patient received a total of 6 sutures, he was placed in a splint for protection, will start him on antibiotics due to concerns of infection.  Provide him with pain medications, follow-up with hand surgery for further evaluation.  Given strict return precautions.  Vital signs have remained stable, no indication for hospital admission.  Patient given at home care as well strict return precautions.  Patient verbalized that they understood agreed to said plan.  Final Clinical Impression(s) / ED Diagnoses Final diagnoses:  Laceration of left middle finger without foreign body with damage to nail, initial encounter    Rx / DC Orders ED Discharge Orders          Ordered    cephALEXin (KEFLEX) 500 MG capsule  4 times daily        08/27/21 1600    oxyCODONE-acetaminophen (PERCOCET/ROXICET) 5-325 MG tablet  Every 8 hours PRN        08/27/21 1600             Aron Baba 08/27/21 [place],  Rebeca Alert, MD 08/27/21 1911

## 2021-08-27 NOTE — ED Triage Notes (Addendum)
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  Bleeding controlled currently.  Last tetanus shot is unknown.

## 2021-09-04 DIAGNOSIS — M79645 Pain in left finger(s): Secondary | ICD-10-CM | POA: Diagnosis not present

## 2021-11-19 ENCOUNTER — Telehealth (INDEPENDENT_AMBULATORY_CARE_PROVIDER_SITE_OTHER): Payer: Self-pay

## 2021-11-19 ENCOUNTER — Encounter (INDEPENDENT_AMBULATORY_CARE_PROVIDER_SITE_OTHER): Payer: Self-pay

## 2021-11-19 ENCOUNTER — Other Ambulatory Visit (INDEPENDENT_AMBULATORY_CARE_PROVIDER_SITE_OTHER): Payer: Self-pay

## 2021-11-19 ENCOUNTER — Telehealth: Payer: Self-pay | Admitting: Family Medicine

## 2021-11-19 DIAGNOSIS — I10 Essential (primary) hypertension: Secondary | ICD-10-CM

## 2021-11-19 DIAGNOSIS — Z860100 Personal history of colon polyps, unspecified: Secondary | ICD-10-CM

## 2021-11-19 DIAGNOSIS — Z79899 Other long term (current) drug therapy: Secondary | ICD-10-CM

## 2021-11-19 DIAGNOSIS — Z800 Family history of malignant neoplasm of digestive organs: Secondary | ICD-10-CM

## 2021-11-19 DIAGNOSIS — Z1159 Encounter for screening for other viral diseases: Secondary | ICD-10-CM

## 2021-11-19 DIAGNOSIS — Z125 Encounter for screening for malignant neoplasm of prostate: Secondary | ICD-10-CM

## 2021-11-19 DIAGNOSIS — Z114 Encounter for screening for human immunodeficiency virus [HIV]: Secondary | ICD-10-CM

## 2021-11-19 DIAGNOSIS — E785 Hyperlipidemia, unspecified: Secondary | ICD-10-CM

## 2021-11-19 MED ORDER — PEG 3350-KCL-NA BICARB-NACL 420 G PO SOLR: 4000.0000 mL | ORAL | 0 refills | Status: DC

## 2021-11-19 NOTE — Telephone Encounter (Signed)
Referring MD/PCP: Sallee Lange  Procedure: Tcs w/PROPOFOL  Reason/Indication:  hx of polyps, fam hx of colon ca  Has patient had this procedure before?  yes  If so, when, by whom and where?  2017  Is there a family history of colon cancer?  yes  Who?  What age when diagnosed?  brother  Is patient diabetic? If yes, Type 1 or Type 2   no      Does patient have prosthetic heart valve or mechanical valve?  no  Do you have a pacemaker/defibrillator?  no  Has patient ever had endocarditis/atrial fibrillation? no  Does patient use oxygen? no  Has patient had joint replacement within last 12 months?  no  Is patient constipated or do they take laxatives? no  Does patient have a history of alcohol/drug use?  no  Have you had a stroke/heart attack last 6 mths? no  Do you take medicine for weight loss?  no  For male patients,: have you had a hysterectomy or are you post menopausal and do you still have your menstrual cycle? N/A  Is patient on blood thinner such as Coumadin, Plavix and/or Aspirin? no  Medications: Lisinopril 10 mg daily, Red yest rice bid  Allergies: nkda  Medication Adjustment per Dr Laural Golden none  Procedure date & time: Thursday 11/27/2021 at 10:05

## 2021-11-19 NOTE — Telephone Encounter (Signed)
Lipid liver met 7 psa hep c ab and hiv ab Hyperlip Htn Screening wellness

## 2021-11-19 NOTE — Telephone Encounter (Signed)
Bradleigh Sonnen Ann Rayvon Brandvold, CMA  ?

## 2021-11-19 NOTE — Telephone Encounter (Signed)
Pt has physical scheduled for 12/16/21. Please send orders for labwork. [phone]

## 2021-11-20 NOTE — Telephone Encounter (Signed)
Blood work ordered in Epic. Left message to return call to notify patient. 

## 2021-11-21 NOTE — Telephone Encounter (Signed)
Patient notified

## 2021-11-27 ENCOUNTER — Ambulatory Visit (HOSPITAL_COMMUNITY)
Admission: RE | Admit: 2021-11-27 | Discharge: 2021-11-27 | Disposition: A | Discharge status: Home / Self Care | Payer: BC Managed Care – PPO | Attending: Internal Medicine | Admitting: Internal Medicine

## 2021-11-27 ENCOUNTER — Other Ambulatory Visit: Payer: Self-pay

## 2021-11-27 ENCOUNTER — Encounter (HOSPITAL_COMMUNITY): Payer: Self-pay | Admitting: Internal Medicine

## 2021-11-27 ENCOUNTER — Encounter (HOSPITAL_COMMUNITY)
Admission: RE | Disposition: A | Discharge status: Home / Self Care | Payer: Self-pay | Source: Home / Self Care | Attending: Internal Medicine

## 2021-11-27 DIAGNOSIS — K621 Rectal polyp: Secondary | ICD-10-CM | POA: Diagnosis not present

## 2021-11-27 DIAGNOSIS — Z800 Family history of malignant neoplasm of digestive organs: Secondary | ICD-10-CM | POA: Diagnosis not present

## 2021-11-27 DIAGNOSIS — K5730 Diverticulosis of large intestine without perforation or abscess without bleeding: Secondary | ICD-10-CM | POA: Insufficient documentation

## 2021-11-27 DIAGNOSIS — K644 Residual hemorrhoidal skin tags: Secondary | ICD-10-CM | POA: Insufficient documentation

## 2021-11-27 DIAGNOSIS — Z1211 Encounter for screening for malignant neoplasm of colon: Secondary | ICD-10-CM | POA: Insufficient documentation

## 2021-11-27 DIAGNOSIS — Z86010 Personal history of colonic polyps: Secondary | ICD-10-CM

## 2021-11-27 DIAGNOSIS — K648 Other hemorrhoids: Secondary | ICD-10-CM

## 2021-11-27 DIAGNOSIS — K635 Polyp of colon: Secondary | ICD-10-CM | POA: Diagnosis not present

## 2021-11-27 HISTORY — PX: POLYPECTOMY: SHX5525

## 2021-11-27 HISTORY — PX: COLONOSCOPY: SHX5424

## 2021-11-27 LAB — HM COLONOSCOPY

## 2021-11-27 SURGERY — COLONOSCOPY
Anesthesia: Moderate Sedation

## 2021-11-27 MED ORDER — MEPERIDINE HCL 50 MG/ML IJ SOLN
INTRAMUSCULAR | Status: AC
  Filled 2021-11-27: qty 1

## 2021-11-27 MED ORDER — MIDAZOLAM HCL 5 MG/5ML IJ SOLN
INTRAMUSCULAR | Status: DC | PRN
  Administered 2021-11-27 (×3): 2 mg via INTRAVENOUS

## 2021-11-27 MED ORDER — MEPERIDINE HCL 50 MG/ML IJ SOLN
INTRAMUSCULAR | Status: DC | PRN
  Administered 2021-11-27 (×2): 25 mg via INTRAVENOUS

## 2021-11-27 MED ORDER — SODIUM CHLORIDE 0.9 % IV SOLN: INTRAVENOUS | Status: DC

## 2021-11-27 MED ORDER — MIDAZOLAM HCL 5 MG/5ML IJ SOLN
INTRAMUSCULAR | Status: AC
  Filled 2021-11-27: qty 10

## 2021-11-27 NOTE — Op Note (Signed)
[facility] Patient Name: Stephen Barnes Procedure Date: 11/27/2021 9:26 AM MRN: [id number] Date of Birth: [birth year] Attending MD: Hildred Laser , MD CSN: [id number] Age: 61 Admit Type: Outpatient Procedure:                Colonoscopy Indications:              High risk colon cancer surveillance: Personal                            history of colonic polyps, Family history of colon                            cancer in a first-degree relative before age 78                            years Providers:                Hildred Laser, MD, Rosina Lowenstein, RN, Raphael Gibney,                            Technician Referring MD:             Sallee Lange, MD Medicines:                Meperidine 50 mg IV, Midazolam 6 mg IV Complications:            No immediate complications. Estimated Blood Loss:     Estimated blood loss was minimal. Procedure:                Pre-Anesthesia Assessment:                           - Prior to the procedure, a History and Physical                            was performed, and patient medications and                            allergies were reviewed. The patient's tolerance of                            previous anesthesia was also reviewed. The risks                            and benefits of the procedure and the sedation                            options and risks were discussed with the patient.                            All questions were answered, and informed consent                            was obtained. Prior Anticoagulants: The patient has  taken no previous anticoagulant or antiplatelet                            agents. ASA Grade Assessment: II - A patient with                            mild systemic disease. After reviewing the risks                            and benefits, the patient was deemed in                            satisfactory condition to undergo the procedure.                           After obtaining  informed consent, the colonoscope                            was passed under direct vision. Throughout the                            procedure, the patient's blood pressure, pulse, and                            oxygen saturations were monitored continuously. The                            [id number]-[id number] ([id number]) was introduced through the                            anus and advanced to the the terminal ileum, with                            identification of the appendiceal orifice and IC                            valve. The colonoscopy was performed without                            difficulty. The patient tolerated the procedure                            well. The quality of the bowel preparation was                            excellent. The terminal ileum, ileocecal valve,                            appendiceal orifice, and rectum were photographed. Scope In: 9:50:49 AM Scope Out: 10:07:51 AM Scope Withdrawal Time: 0 hours 11 minutes 11 seconds  Total Procedure Duration: 0 hours 17 minutes 2 seconds  Findings:      The perianal and digital rectal examinations were normal.      The terminal ileum appeared normal.  A few diverticula were found in the sigmoid colon.      Two polyps were found in the rectum and distal sigmoid colon. The polyps       were small in size. These polyps were removed with a cold snare.       Resection and retrieval were complete. The pathology specimen was placed       into Bottle Number 1.      External hemorrhoids were found during retroflexion. The hemorrhoids       were small. Impression:               - The examined portion of the ileum was normal.                           - Diverticulosis in the sigmoid colon.                           - Two small polyps in the rectum and in the distal                            sigmoid colon, removed with a cold snare. Resected                            and retrieved.                           - External  hemorrhoids. Moderate Sedation:      Moderate (conscious) sedation was administered by the endoscopy nurse       and supervised by the endoscopist. The following parameters were       monitored: oxygen saturation, heart rate, blood pressure, CO2       capnography and response to care. Total physician intraservice time was       31 minutes. Recommendation:           - Patient has a contact number available for                            emergencies. The signs and symptoms of potential                            delayed complications were discussed with the                            patient. Return to normal activities tomorrow.                            Written discharge instructions were provided to the                            patient.                           - High fiber diet today.                           - Continue present medications.                           -  No aspirin, ibuprofen, naproxen, or other                            non-steroidal anti-inflammatory drugs for 1 day.                           - Await pathology results.                           - Repeat colonoscopy in 5 years for surveillance. Procedure Code(s):        --- Professional ---                           [phone], Colonoscopy, flexible; with removal of                            tumor(s), polyp(s), or other lesion(s) by snare                            technique                           99153, Moderate sedation; each additional 15                            minutes intraservice time                           G0500, Moderate sedation services provided by the                            same physician or other qualified health care                            professional performing a gastrointestinal                            endoscopic service that sedation supports,                            requiring the presence of an independent trained                            observer to assist in the monitoring  of the                            patient's level of consciousness and physiological                            status; initial 15 minutes of intra-service time;                            patient age 64 years or older (additional time 48  be reported with [phone], as appropriate) Diagnosis Code(s):        --- Professional ---                           Z86.010, Personal history of colonic polyps                           K64.4, Residual hemorrhoidal skin tags                           K62.1, Rectal polyp                           K63.5, Polyp of colon                           Z80.0, Family history of malignant neoplasm of                            digestive organs                           K57.30, Diverticulosis of large intestine without                            perforation or abscess without bleeding CPT copyright 2019 American Medical Association. All rights reserved. The codes documented in this report are preliminary and upon coder review may  be revised to meet current compliance requirements. Hildred Laser, MD Hildred Laser, MD 11/27/2021 10:18:38 AM This report has been signed electronically. Number of Addenda: 0

## 2021-11-27 NOTE — Discharge Instructions (Signed)
No aspirin or NSAIDs for 24 hours. Resume lisinopril as before. High-fiber diet. No driving for 24 hours. Physician will call with biopsy results. Next colonoscopy in 5 years.

## 2021-11-27 NOTE — H&P (Signed)
Stephen Barnes is an 61 y.o. male.   Chief Complaint: Patient is here for colonoscopy. HPI: Patient is 61 year old Caucasian male who has history of colonic adenomas and family history of colon carcinoma and a brother who is here for surveillance colonoscopy.  His last exam was in May 2017 with removal of 2 small polyps and these were tubular adenomas.  Patient denies abdominal pain change in bowel habits or rectal bleeding. Family history significant for colon carcinoma in his brother who who was possibly in his 51s and had surgery and died within a year. Patient does not take aspirin or anticoagulants.   Past Medical History:  Diagnosis Date   HTN (hypertension) 08/15/2019   Medical history non-contributory     Past Surgical History:  Procedure Laterality Date   CATARACT EXTRACTION W/PHACO Right 06/23/2019   Procedure: CATARACT EXTRACTION PHACO AND INTRAOCULAR LENS PLACEMENT (IOC);  Surgeon: Baruch Goldmann, MD;  Location: [facility];  Service: Ophthalmology;  Laterality: Right;  CDE: 4.04   COLONOSCOPY N/A 04/01/2016   Procedure: COLONOSCOPY;  Surgeon: Rogene Houston, MD;  Location: [facility];  Service: Endoscopy;  Laterality: N/A;  84   NO PAST SURGERIES      Family History  Problem Relation Age of Onset   Colon cancer Brother    Social History:  reports that he has quit smoking. He has never used smokeless tobacco. He reports that he does not drink alcohol and does not use drugs.  Allergies: No Known Allergies  Medications Prior to Admission  Medication Sig Dispense Refill   lisinopril (ZESTRIL) 10 MG tablet 1 qd (Patient taking differently: Take 10 mg by mouth daily.) 90 tablet 1   polyethylene glycol-electrolytes (TRILYTE) 420 g solution Take 4,000 mLs by mouth as directed. 4000 mL 0    No results found for this or any previous visit (from the past 48 hour(s)). No results found.  Review of Systems  Blood pressure (!) 148/89, temperature (!) 97.5 F (36.4 C), temperature  source Oral, height 5\' 11"  (1.803 m), weight 96.2 kg. Physical Exam HENT:     Mouth/Throat:     Mouth: Mucous membranes are moist.     Pharynx: Oropharynx is clear.  Eyes:     General: No scleral icterus.    Conjunctiva/sclera: Conjunctivae normal.  Cardiovascular:     Rate and Rhythm: Normal rate and regular rhythm.     Heart sounds: Normal heart sounds. No murmur heard. Pulmonary:     Effort: Pulmonary effort is normal.     Breath sounds: Normal breath sounds.  Abdominal:     General: There is no distension.     Palpations: Abdomen is soft. There is no mass.     Tenderness: There is abdominal tenderness.  Musculoskeletal:        General: No swelling.     Cervical back: Neck supple.  Lymphadenopathy:     Cervical: No cervical adenopathy.  Skin:    General: Skin is warm and dry.  Neurological:     Mental Status: He is alert.     Assessment/Plan  History of colonic polyps/tubular adenomas Family history of CRC in first-degree relative younger than 1.  Surveillance colonoscopy.  Hildred Laser, MD 11/27/2021, 9:39 AM

## 2021-11-28 ENCOUNTER — Encounter (INDEPENDENT_AMBULATORY_CARE_PROVIDER_SITE_OTHER): Payer: Self-pay | Admitting: *Deleted

## 2021-11-28 LAB — SURGICAL PATHOLOGY

## 2021-12-01 ENCOUNTER — Encounter (HOSPITAL_COMMUNITY): Payer: Self-pay | Admitting: Internal Medicine

## 2021-12-04 ENCOUNTER — Encounter (INDEPENDENT_AMBULATORY_CARE_PROVIDER_SITE_OTHER): Payer: Self-pay | Admitting: *Deleted

## 2021-12-10 DIAGNOSIS — Z125 Encounter for screening for malignant neoplasm of prostate: Secondary | ICD-10-CM | POA: Diagnosis not present

## 2021-12-10 DIAGNOSIS — E785 Hyperlipidemia, unspecified: Secondary | ICD-10-CM | POA: Diagnosis not present

## 2021-12-10 DIAGNOSIS — Z114 Encounter for screening for human immunodeficiency virus [HIV]: Secondary | ICD-10-CM | POA: Diagnosis not present

## 2021-12-10 DIAGNOSIS — Z79899 Other long term (current) drug therapy: Secondary | ICD-10-CM | POA: Diagnosis not present

## 2021-12-10 DIAGNOSIS — Z1159 Encounter for screening for other viral diseases: Secondary | ICD-10-CM | POA: Diagnosis not present

## 2021-12-11 LAB — BASIC METABOLIC PANEL
BUN/Creatinine Ratio: 14 (ref 10–24)
BUN: 16 mg/dL (ref 8–27)
CO2: 23 mmol/L (ref 20–29)
Calcium: 9.1 mg/dL (ref 8.6–10.2)
Chloride: 109 mmol/L — ABNORMAL HIGH (ref 96–106)
Creatinine, Ser: 1.17 mg/dL (ref 0.76–1.27)
Glucose: 89 mg/dL (ref 70–99)
Potassium: 4.4 mmol/L (ref 3.5–5.2)
Sodium: 148 mmol/L — ABNORMAL HIGH (ref 134–144)
eGFR: 71 mL/min/{1.73_m2} (ref >59)

## 2021-12-11 LAB — LIPID PANEL
Chol/HDL Ratio: 3.3 ratio (ref 0.0–5.0)
Cholesterol, Total: 92 mg/dL — ABNORMAL LOW (ref 100–199)
HDL: 28 mg/dL — ABNORMAL LOW (ref >39)
LDL Chol Calc (NIH): 50 mg/dL (ref 0–99)
Triglycerides: 59 mg/dL (ref 0–149)
VLDL Cholesterol Cal: 14 mg/dL (ref 5–40)

## 2021-12-11 LAB — HEPATIC FUNCTION PANEL
ALT: 26 IU/L (ref 0–44)
AST: 25 IU/L (ref 0–40)
Albumin: 4.8 g/dL (ref 3.8–4.9)
Alkaline Phosphatase: 90 IU/L (ref 44–121)
Bilirubin Total: 0.8 mg/dL (ref 0.0–1.2)
Bilirubin, Direct: 0.26 mg/dL (ref 0.00–0.40)
Total Protein: 6.9 g/dL (ref 6.0–8.5)

## 2021-12-11 LAB — HIV ANTIBODY (ROUTINE TESTING W REFLEX): HIV Screen 4th Generation wRfx: NONREACTIVE

## 2021-12-11 LAB — HEPATITIS C ANTIBODY: Hep C Virus Ab: <0.1 s/co ratio (ref 0.0–0.9)

## 2021-12-11 LAB — PSA: Prostate Specific Ag, Serum: 0.5 ng/mL (ref 0.0–4.0)

## 2021-12-16 ENCOUNTER — Encounter: Payer: BC Managed Care – PPO | Admitting: Family Medicine

## 2021-12-19 ENCOUNTER — Encounter: Payer: Self-pay | Admitting: Family Medicine

## 2021-12-19 ENCOUNTER — Other Ambulatory Visit: Payer: Self-pay

## 2021-12-19 ENCOUNTER — Ambulatory Visit (INDEPENDENT_AMBULATORY_CARE_PROVIDER_SITE_OTHER): Payer: BC Managed Care – PPO | Admitting: Family Medicine

## 2021-12-19 VITALS — BP 136/78 | Temp 97.7°F | Wt 223.4 lb

## 2021-12-19 DIAGNOSIS — Z0000 Encounter for general adult medical examination without abnormal findings: Secondary | ICD-10-CM

## 2021-12-19 MED ORDER — LISINOPRIL 10 MG PO TABS: 10.0000 mg | ORAL_TABLET | Freq: Every day | ORAL | 1 refills | Status: DC

## 2021-12-19 NOTE — Progress Notes (Signed)
° °  Subjective:    Patient ID: Stephen Barnes, male    DOB: 1961-10-13, 61 y.o.   MRN: [id number]  HPI The patient comes in today for a wellness visit.    A review of their health history was completed.  A review of medications was also completed.  Any needed refills; Lisinopril   Eating habits: good   Falls/  MVA accidents in past few months: none  Regular exercise: yes  Specialist pt sees on regular basis: none  Preventative health issues were discussed.   Additional concerns: none His cholesterol profile looks amazingly well He is trying to eat healthy and stay active Blood pressure good  Review of Systems     Objective:   Physical Exam  General-in no acute distress Eyes-no discharge Lungs-respiratory rate normal, CTA CV-no murmurs,RRR Extremities skin warm dry no edema Neuro grossly normal Behavior normal, alert Prostate normal      Assessment & Plan:  Adult wellness-complete.wellness physical was conducted today. Importance of diet and exercise were discussed in detail.  In addition to this a discussion regarding safety was also covered. We also reviewed over immunizations and gave recommendations regarding current immunization needed for age.  In addition to this additional areas were also touched on including: Preventative health exams needed:  Colonoscopy patient had colonoscopy 2023 next one is 2028  Patient was advised yearly wellness exam Shingles vaccine recommended Follow-up 6 months on blood pressure yearly wellness recommended Patient has been off of cigarettes for over 15 years Would not recommend CT scan based on protocols

## 2021-12-19 NOTE — Patient Instructions (Signed)

## 2022-01-05 ENCOUNTER — Other Ambulatory Visit: Payer: Self-pay | Admitting: Family Medicine

## 2022-01-05 DIAGNOSIS — M109 Gout, unspecified: Secondary | ICD-10-CM

## 2022-01-06 ENCOUNTER — Telehealth: Payer: Self-pay | Admitting: Family Medicine

## 2022-01-06 NOTE — Telephone Encounter (Signed)
Patient requesting refill on gout medication be to called into [place]

## 2022-01-07 ENCOUNTER — Other Ambulatory Visit: Payer: Self-pay | Admitting: Family Medicine

## 2022-01-07 DIAGNOSIS — M109 Gout, unspecified: Secondary | ICD-10-CM

## 2022-01-07 MED ORDER — COLCHICINE 0.6 MG PO TABS: ORAL_TABLET | ORAL | 1 refills | Status: DC

## 2022-01-07 NOTE — Telephone Encounter (Signed)
Prescription was sent to [facility] for colchicine-patient can call the pharmacy to let them know if he is going to pick it up or deliver If ongoing troubles recommend office visit

## 2022-01-07 NOTE — Telephone Encounter (Signed)
LMTRC

## 2022-01-09 NOTE — Telephone Encounter (Signed)
Confirmed with [place] [facility] that patient picked up his gout medication.

## 2022-06-06 IMAGING — DX DG FINGER MIDDLE 2+V*L*
3 series · 3 of 3 positions shown · non-contrast
Comparison: 05/28/2017 hand radiographs

CLINICAL DATA: Distal injury with laceration

EXAM:
LEFT MIDDLE FINGER 2+V

[finger ap]
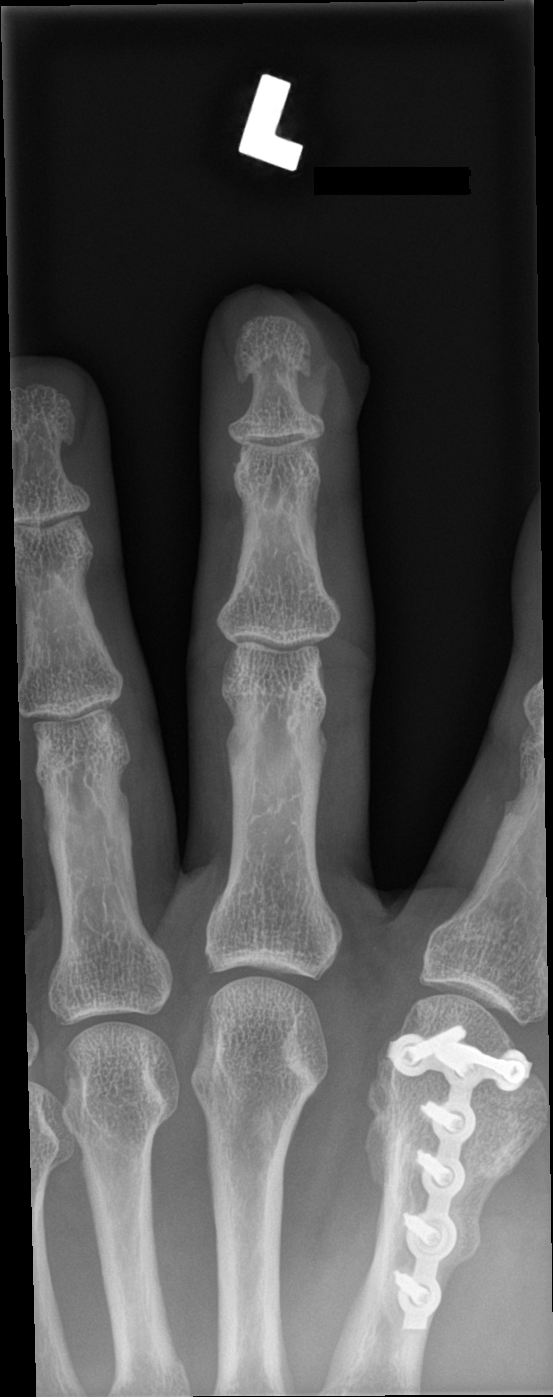

[finger obl]
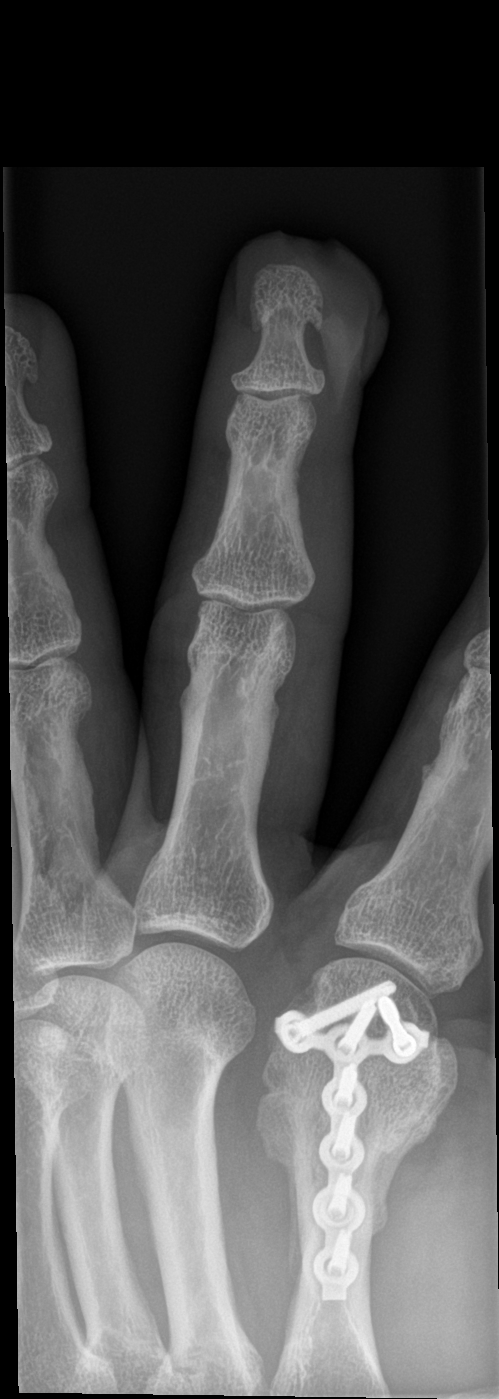

[finger lat]
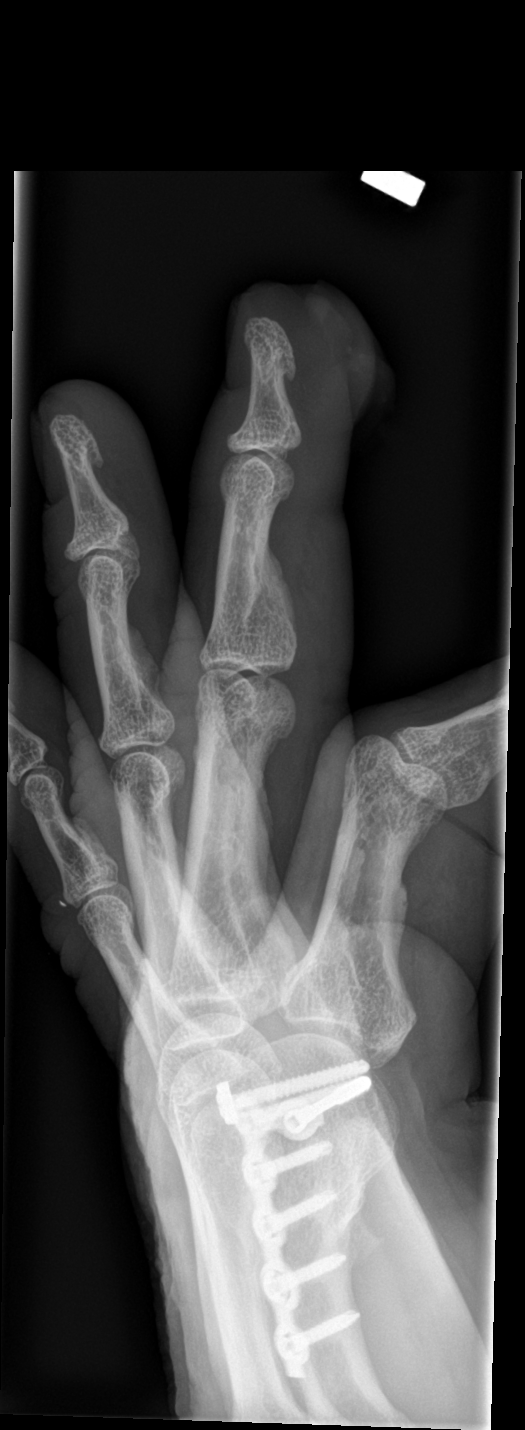

[3 of 3 positions shown; findings below may reference images not displayed]

FINDINGS: Remote fixation of the second distal metacarpal.

Soft tissue injury involving the tuft of the third digit. No acute
fracture or dislocation. No radiopaque foreign object.
IMPRESSION: No acute osseous abnormality.

## 2022-06-18 ENCOUNTER — Ambulatory Visit: Payer: Self-pay | Admitting: Family Medicine

## 2022-07-11 ENCOUNTER — Other Ambulatory Visit: Payer: Self-pay | Admitting: Family Medicine

## 2022-07-14 ENCOUNTER — Ambulatory Visit (INDEPENDENT_AMBULATORY_CARE_PROVIDER_SITE_OTHER): Payer: BC Managed Care – PPO | Admitting: Family Medicine

## 2022-07-14 ENCOUNTER — Encounter: Payer: Self-pay | Admitting: Family Medicine

## 2022-07-14 VITALS — BP 112/72 | Wt 222.0 lb

## 2022-07-14 DIAGNOSIS — I10 Essential (primary) hypertension: Secondary | ICD-10-CM | POA: Diagnosis not present

## 2022-07-14 MED ORDER — LISINOPRIL 10 MG PO TABS: ORAL_TABLET | ORAL | 1 refills | Status: DC

## 2022-07-14 NOTE — Patient Instructions (Signed)
Hi Corbet Is good to see you this year.  Overall I believe you are doing well.  We will see you in January for your wellness visit.  [facility]Care-Dr. Nicki Reaper

## 2022-07-14 NOTE — Progress Notes (Signed)
   Subjective:    Patient ID: Stephen Barnes, male    DOB: 05-27-1961, 61 y.o.   MRN: [id number]  HPI Pt arrives for follow up on HTN. Pt states doing well on Lisinopril.  Tries to watch diet Has not taken Lisinopril at home due to running out. No issues with blood pressure.   Has been having some pain in both legs/knees and hip area.  Relates pain discomfort more so in the knees and in the patellar region on the left side   Review of Systems     Objective:   Physical Exam  General-in no acute distress Eyes-no discharge Lungs-respiratory rate normal, CTA CV-no murmurs,RRR Extremities skin warm dry no edema Neuro grossly normal Behavior normal, alert       Assessment & Plan:  Mild patellar tendinitis left side should gradually get better with anti-inflammatories cold compresses  HTN would recommend continuing medication.  Wellness checkup in January with lab work ordered at that time

## 2022-07-16 ENCOUNTER — Other Ambulatory Visit: Payer: Self-pay

## 2022-07-16 ENCOUNTER — Telehealth: Payer: Self-pay

## 2022-07-16 DIAGNOSIS — I10 Essential (primary) hypertension: Secondary | ICD-10-CM

## 2022-07-16 DIAGNOSIS — E785 Hyperlipidemia, unspecified: Secondary | ICD-10-CM

## 2022-07-16 DIAGNOSIS — Z0000 Encounter for general adult medical examination without abnormal findings: Secondary | ICD-10-CM

## 2022-07-16 DIAGNOSIS — Z79899 Other long term (current) drug therapy: Secondary | ICD-10-CM

## 2022-07-16 DIAGNOSIS — Z125 Encounter for screening for malignant neoplasm of prostate: Secondary | ICD-10-CM

## 2022-07-16 NOTE — Telephone Encounter (Signed)
-----   Message from Kathyrn Drown, MD sent at 07/14/2022  4:41 PM EDT ----- Orders only-lipid, liver, metabolic 7, PSA for wellness HTN hyperlipidemia

## 2022-07-16 NOTE — Telephone Encounter (Signed)
Patient has been informed per lab orders

## 2022-11-19 DIAGNOSIS — Z79899 Other long term (current) drug therapy: Secondary | ICD-10-CM | POA: Diagnosis not present

## 2022-11-19 DIAGNOSIS — I10 Essential (primary) hypertension: Secondary | ICD-10-CM | POA: Diagnosis not present

## 2022-11-19 DIAGNOSIS — E785 Hyperlipidemia, unspecified: Secondary | ICD-10-CM | POA: Diagnosis not present

## 2022-11-19 DIAGNOSIS — Z125 Encounter for screening for malignant neoplasm of prostate: Secondary | ICD-10-CM | POA: Diagnosis not present

## 2022-11-20 LAB — HEPATIC FUNCTION PANEL
ALT: 18 IU/L (ref 0–44)
AST: 20 IU/L (ref 0–40)
Albumin: 4.8 g/dL (ref 3.8–4.9)
Alkaline Phosphatase: 80 IU/L (ref 44–121)
Bilirubin Total: 0.5 mg/dL (ref 0.0–1.2)
Bilirubin, Direct: 0.13 mg/dL (ref 0.00–0.40)
Total Protein: 6.7 g/dL (ref 6.0–8.5)

## 2022-11-20 LAB — BASIC METABOLIC PANEL
BUN/Creatinine Ratio: 15 (ref 10–24)
BUN: 19 mg/dL (ref 8–27)
CO2: 23 mmol/L (ref 20–29)
Calcium: 9.6 mg/dL (ref 8.6–10.2)
Chloride: 104 mmol/L (ref 96–106)
Creatinine, Ser: 1.24 mg/dL (ref 0.76–1.27)
Glucose: 96 mg/dL (ref 70–99)
Potassium: 4.5 mmol/L (ref 3.5–5.2)
Sodium: 141 mmol/L (ref 134–144)
eGFR: 67 mL/min/{1.73_m2} (ref >59)

## 2022-11-20 LAB — LIPID PANEL
Chol/HDL Ratio: 4.4 ratio (ref 0.0–5.0)
Cholesterol, Total: 154 mg/dL (ref 100–199)
HDL: 35 mg/dL — ABNORMAL LOW (ref >39)
LDL Chol Calc (NIH): 101 mg/dL — ABNORMAL HIGH (ref 0–99)
Triglycerides: 98 mg/dL (ref 0–149)
VLDL Cholesterol Cal: 18 mg/dL (ref 5–40)

## 2022-11-20 LAB — PSA: Prostate Specific Ag, Serum: 0.4 ng/mL (ref 0.0–4.0)

## 2022-12-23 ENCOUNTER — Ambulatory Visit (INDEPENDENT_AMBULATORY_CARE_PROVIDER_SITE_OTHER): Payer: BC Managed Care – PPO | Admitting: Family Medicine

## 2022-12-23 ENCOUNTER — Ambulatory Visit (HOSPITAL_COMMUNITY)
Admission: RE | Admit: 2022-12-23 | Discharge: 2022-12-23 | Disposition: A | Discharge status: Home / Self Care | Payer: BC Managed Care – PPO | Source: Ambulatory Visit | Attending: Family Medicine | Admitting: Family Medicine

## 2022-12-23 VITALS — BP 120/72 | Ht 71.0 in | Wt 227.6 lb

## 2022-12-23 DIAGNOSIS — R052 Subacute cough: Secondary | ICD-10-CM | POA: Diagnosis not present

## 2022-12-23 DIAGNOSIS — R059 Cough, unspecified: Secondary | ICD-10-CM | POA: Diagnosis not present

## 2022-12-23 DIAGNOSIS — Z0001 Encounter for general adult medical examination with abnormal findings: Secondary | ICD-10-CM | POA: Diagnosis not present

## 2022-12-23 DIAGNOSIS — E785 Hyperlipidemia, unspecified: Secondary | ICD-10-CM | POA: Diagnosis not present

## 2022-12-23 DIAGNOSIS — Z0000 Encounter for general adult medical examination without abnormal findings: Secondary | ICD-10-CM

## 2022-12-23 MED ORDER — VALSARTAN 80 MG PO TABS: 80.0000 mg | ORAL_TABLET | Freq: Every day | ORAL | 1 refills | Status: DC

## 2022-12-23 NOTE — Progress Notes (Signed)
   Subjective:    Patient ID: Stephen Barnes, male    DOB: 1960/12/03, 62 y.o.   MRN: [id number]  HPI The patient comes in today for a wellness visit.    A review of their health history was completed.  A review of medications was also completed.  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   Additional concerns: no concerns or issues   The 10-year ASCVD risk score (Arnett DK, et al., 2019) is: 9.9%   Values used to calculate the score:     Age: 89 years     Sex: Male     Is Non-Hispanic African American: No     Diabetic: No     Tobacco smoker: No     Systolic Blood Pressure: 062 mmHg     Is BP treated: Yes     HDL Cholesterol: 35 mg/dL     Total Cholesterol: 154 mg/dL    Review of Systems     Objective:   Physical Exam General-in no acute distress Eyes-no discharge Lungs-respiratory rate normal, CTA CV-no murmurs,RRR Extremities skin warm dry no edema Neuro grossly normal Behavior normal, alert Prostate exam normal       Assessment & Plan:  1. Well adult exam Adult wellness-complete.wellness physical was conducted today. Importance of diet and exercise were discussed in detail.  Importance of stress reduction and healthy living were discussed.  In addition to this a discussion regarding safety was also covered.  We also reviewed over immunizations and gave recommendations regarding current immunization needed for age.   In addition to this additional areas were also touched on including: Preventative health exams needed:  Colonoscopy up-to-date  Patient was advised yearly wellness exam   2. Hyperlipidemia, unspecified hyperlipidemia type Patient does have increased risk of heart disease he is willing to do the coronary  calcium score we will go forward with that - CT CARDIAC SCORING (SELF PAY ONLY)  3. Subacute cough Chest x-ray, former smoker quit several years back May need to do further testing if ongoing troubles May need CAT scan In addition to this switch away from lisinopril go to valsartan follow-up within 4 to 6 weeks to recheck blood pressure - DG Chest 2 View

## 2023-01-27 ENCOUNTER — Telehealth: Payer: Self-pay

## 2023-01-27 NOTE — Telephone Encounter (Signed)
Spoke with patient's wife she is concerned that the valsartan 80 mg is causing his blood pressure to drop to 95/60. Patient denies feeling dizzy. Patient states he is extremely tired.  Patient's wife would like to know if he can go back on the 10 mg lisinopril until his next visit?   Patient 's wife has decided that patient should not take the valsartan 80 mg anymore. Patient's wife has been instructed to monitor BP if patient starts feeling worse to take patient to nearest ED.

## 2023-01-27 NOTE — Telephone Encounter (Signed)
So in this situation he can go back to the 10 mg lisinopril until his follow-up visit with me Or they can monitor his blood pressure if they are choosing not to take anything. Another option is reducing the valsartan to 40 mg Valsartan also comes in a lower dose of 40 mg which would be one half of the 80 mg I agree that the blood pressures mentioned in this note are on the low side and could make him feel very fatigued or tired. Please keep follow-up visit for next week May proceed with one of the above options

## 2023-01-28 ENCOUNTER — Telehealth: Payer: Self-pay

## 2023-01-28 MED ORDER — LISINOPRIL 10 MG PO TABS: 10.0000 mg | ORAL_TABLET | Freq: Every day | ORAL | 0 refills | Status: DC

## 2023-01-28 NOTE — Telephone Encounter (Signed)
Left message to return call 

## 2023-01-28 NOTE — Telephone Encounter (Signed)
Discontinue valsartan Go ahead with lisinopril 10 mg, #30, 1 daily, 3 refills Healthy diet, minimize salt, drink plenty of water every day, keep follow-up visit on March 20 thank you

## 2023-01-29 NOTE — Telephone Encounter (Signed)
Leda Min, LPN    Spoke with patient and states would prefer to go back on lisinopril 10 mg instead of continuing with valsartan until upcoming appt , stated will need a refill sent to pharmacy.

## 2023-01-29 NOTE — Telephone Encounter (Signed)
Spoke with patient and wife to reassure understanding of doctors orders regarding BP med and follow up.

## 2023-02-03 ENCOUNTER — Ambulatory Visit: Payer: BC Managed Care – PPO | Admitting: Family Medicine

## 2023-02-03 VITALS — BP 126/83 | Ht 71.0 in | Wt 223.8 lb

## 2023-02-03 DIAGNOSIS — R030 Elevated blood-pressure reading, without diagnosis of hypertension: Secondary | ICD-10-CM | POA: Diagnosis not present

## 2023-02-03 MED ORDER — MUPIROCIN 2 % EX OINT: TOPICAL_OINTMENT | CUTANEOUS | 0 refills | Status: DC

## 2023-02-03 NOTE — Progress Notes (Signed)
   Subjective:    Patient ID: Stephen Barnes, male    DOB: [birth year], 61 y.o.   MRN: [id number]  HPI  Patient arrives for a follow up on blood pressure. Patient had to switch back to Lisinopril due to issues with previous med causing gout and dropping blood pressure  The patient states that when he took the lisinopril he seemed to do fine but when he took the valsartan it causes blood pressure to go low after that he noted that he had a flareup of gout and he thought it may have been triggered by that medicine as well after taking the medicine for several days and feeling very fatigued and tired he stopped the medication. Review of Systems     Objective:   Physical Exam General-in no acute distress Eyes-no discharge Lungs-respiratory rate normal, CTA CV-no murmurs,RRR Extremities skin warm dry no edema Neuro grossly normal Behavior normal, alert        Assessment & Plan:  No sign of gout currently Blood pressure very good off of the medicine He states he is eating healthier watching portions minimizing salt staying more active He will follow his blood pressures closely he brought his machine in today it is comparable to what we got on our machine he will send [place] readings within the next 2 weeks if the readings are looking good he will not need to be on any medication  Also he has a impetigo left nostril related to a upper respiratory illness his lungs sounded clear did not hear any pneumonia he was told what warning signs watch for no need for antibiotics we will use Bactroban ointment on this area twice daily for the next 7 to 14 days

## 2023-02-10 ENCOUNTER — Ambulatory Visit (HOSPITAL_COMMUNITY)
Admission: RE | Admit: 2023-02-10 | Discharge: 2023-02-10 | Disposition: A | Discharge status: Home / Self Care | Payer: BC Managed Care – PPO | Source: Ambulatory Visit | Attending: Family Medicine | Admitting: Family Medicine

## 2023-02-10 DIAGNOSIS — E785 Hyperlipidemia, unspecified: Secondary | ICD-10-CM | POA: Insufficient documentation

## 2023-02-21 ENCOUNTER — Encounter: Payer: Self-pay | Admitting: Family Medicine

## 2023-03-22 ENCOUNTER — Ambulatory Visit: Payer: BC Managed Care – PPO | Admitting: Family Medicine

## 2023-03-22 VITALS — BP 139/78 | Ht 71.0 in | Wt 221.4 lb

## 2023-03-22 DIAGNOSIS — R002 Palpitations: Secondary | ICD-10-CM

## 2023-03-22 DIAGNOSIS — R931 Abnormal findings on diagnostic imaging of heart and coronary circulation: Secondary | ICD-10-CM

## 2023-03-22 DIAGNOSIS — E785 Hyperlipidemia, unspecified: Secondary | ICD-10-CM

## 2023-03-22 DIAGNOSIS — J019 Acute sinusitis, unspecified: Secondary | ICD-10-CM | POA: Diagnosis not present

## 2023-03-22 DIAGNOSIS — Z79899 Other long term (current) drug therapy: Secondary | ICD-10-CM

## 2023-03-22 MED ORDER — ROSUVASTATIN CALCIUM 10 MG PO TABS: ORAL_TABLET | ORAL | 3 refills | Status: DC

## 2023-03-22 MED ORDER — AMOXICILLIN-POT CLAVULANATE 875-125 MG PO TABS: 1.0000 | ORAL_TABLET | Freq: Two times a day (BID) | ORAL | 0 refills | Status: DC

## 2023-03-22 NOTE — Patient Instructions (Signed)
Hi Stephen Barnes  As we discussed With the elevated coronary calcium this increases the risk of heart disease as you get older.  Elevated coronary calcium is a sign of increased cholesterol buildup in the coronary artery.  Yours is not severe but the goal is to prevent progression as you get older.  I would recommend rosuvastatin 10 mg 1 taken on Monday 1 taken on Friday and approximately 8 to 10 weeks please repeat your blood work.  Continue to eat healthy.  If you feel you are having any problems with the medicine to let us know.  As for the cough I believe this is due to sinuses recommend antibiotics twice a day with a snack for at least 7 days along with over-the-counter allergy tablet you may use generic Zyrtec or Claritin and if experiencing nasal congestion you may use generic Flonase

## 2023-03-22 NOTE — Progress Notes (Signed)
   Subjective:    Patient ID: Stephen Barnes, male    DOB: [birth year], 62 y.o.   MRN: [id number]  Hyperlipidemia This is a chronic problem. He is currently on no antihyperlipidemic treatment. There are no compliance problems.  Risk factors for coronary artery disease include dyslipidemia and hypertension.  Very nice patient Recent coronary calcium test showed elevated score We did discuss the significance of this Patient denies any chest tightness pressure pain   Patient also reports cold symptoms and cough- requesting antibiotics Significant head congestion chest congestion coughing denies shortness of breath discomfort in the sinuses as well as discolored mucus Review of Systems     Objective:   Physical Exam General-in no acute distress Eyes-no discharge Lungs-respiratory rate normal, CTA CV-no murmurs,RRR Extremities skin warm dry no edema Neuro grossly normal Behavior normal, alert  Listen to the I did not hear any irregularity      Assessment & Plan:  1. Hyperlipidemia, unspecified hyperlipidemia type Goals get LDL below 78 Did not tolerate Crestor 10 mg daily previously Patient had legs aching Will go with low-dose Crestor 2 days a week and recheck lipid ALT in approximately 8 weeks time Continue healthy eating  2. Elevated coronary artery calcium score Not in the severe category.  No need for stress test currently.  Healthy diet regular physical activity as well as statin to bring down the risk of progression of coronary calcium   3. Acute rhinosinusitis Antibiotics twice daily allergy medicine as needed  4. Palpitations EKG today EKG looks normal His palpitations are only occasional I told if it becomes more persistent I would recommend telemetry

## 2023-06-07 ENCOUNTER — Ambulatory Visit: Payer: BC Managed Care – PPO | Admitting: Family Medicine

## 2023-06-24 DIAGNOSIS — E785 Hyperlipidemia, unspecified: Secondary | ICD-10-CM | POA: Diagnosis not present

## 2023-06-24 DIAGNOSIS — Z79899 Other long term (current) drug therapy: Secondary | ICD-10-CM | POA: Diagnosis not present

## 2023-06-28 ENCOUNTER — Encounter: Payer: Self-pay | Admitting: Family Medicine

## 2023-06-28 NOTE — Telephone Encounter (Signed)
Nurses May send in colchicine 0.6 mg 1 twice daily till relief, #20 with 1 refill  Also Indocin 50 mg 1 taken 3 times daily as needed for pain this is an anti-inflammatory that will help the gout as well as help with pain and discomfort, take with a snack to minimize stomach symptoms, #20 with 1 refill  If not improving over the next 48 hours or if having worsening issues recommend office visit

## 2023-06-29 MED ORDER — COLCHICINE 0.6 MG PO TABS: ORAL_TABLET | ORAL | 1 refills | Status: DC

## 2023-06-29 MED ORDER — INDOMETHACIN 50 MG PO CAPS: 50.0000 mg | ORAL_CAPSULE | Freq: Three times a day (TID) | ORAL | 1 refills | Status: DC

## 2023-09-21 ENCOUNTER — Ambulatory Visit: Payer: BC Managed Care – PPO | Admitting: Family Medicine

## 2023-09-21 VITALS — BP 136/82 | HR 73 | Temp 97.9°F | Ht 71.0 in | Wt 230.0 lb

## 2023-09-21 DIAGNOSIS — E785 Hyperlipidemia, unspecified: Secondary | ICD-10-CM | POA: Diagnosis not present

## 2023-09-21 DIAGNOSIS — R931 Abnormal findings on diagnostic imaging of heart and coronary circulation: Secondary | ICD-10-CM | POA: Diagnosis not present

## 2023-09-21 NOTE — Progress Notes (Signed)
   Subjective:    Patient ID: Stephen Barnes, male    DOB: Sep 19, 1961, 62 y.o.   MRN: [id number]  Discussed the use of AI scribe software for clinical note transcription with the patient, who gave verbal consent to proceed.  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 He maintains a high level of physical activity daily. However, he struggles with maintaining a consistent healthy diet, often fluctuating between healthy and less healthy food choices. He reports trying to snack healthily and minimize bread intake, but also mentions consuming canned spaghetti and homemade sausage. He typically prepares meals at home, only occasionally eating out. His fluid intake is primarily water, with occasional soda and tea.  The patient's sleep pattern is regular, typically going to bed around 9:30-10:00 PM and waking up around 6:00-6:30 AM. He reports no issues with bowel movements or urination. He is currently on cholesterol medication twice a week (Mondays and Fridays) and reports tolerating it well. Recent blood work in August showed improved cholesterol levels, with LDL below 70. The patient declined the flu vaccine.         Review of Systems     Objective:    Physical Exam   VITALS: BP- 136/84 CHEST: Lungs clear to auscultation.    General-in no acute distress Eyes-no discharge Lungs-respiratory rate normal, CTA CV-no murmurs,RRR Extremities skin warm dry no edema Neuro grossly normal Behavior normal, alert        Assessment & Plan:  Assessment and Plan    Hyperlipidemia LDL improved to 63 with twice weekly cholesterol medication. No reported side effects. -Continue cholesterol medication twice weekly. -Refill medication prescription.  Diet and Exercise Patient reports inconsistent healthy eating habits and regular physical  activity. -Encourage continued efforts towards healthy eating and maintaining physical activity. -Advise on incorporating more protein into diet for satiety.  General Health Maintenance Patient declined flu vaccine. -Encourage consideration of flu vaccine at age 19 to reduce risk of pneumonia. -Schedule wellness exam for February 2025. -Order lab work one week prior to next visit.      Blood pressure on recheck looks good LDL control very good continue medication 2 days/week Follow-up for wellness within 6 months labs before that visit

## 2023-09-22 ENCOUNTER — Ambulatory Visit: Payer: BC Managed Care – PPO | Admitting: Family Medicine

## 2023-09-23 ENCOUNTER — Other Ambulatory Visit: Payer: Self-pay

## 2023-09-23 DIAGNOSIS — E785 Hyperlipidemia, unspecified: Secondary | ICD-10-CM

## 2023-09-23 DIAGNOSIS — Z0000 Encounter for general adult medical examination without abnormal findings: Secondary | ICD-10-CM

## 2023-09-23 DIAGNOSIS — I10 Essential (primary) hypertension: Secondary | ICD-10-CM

## 2023-09-23 DIAGNOSIS — Z79899 Other long term (current) drug therapy: Secondary | ICD-10-CM

## 2023-09-23 DIAGNOSIS — Z125 Encounter for screening for malignant neoplasm of prostate: Secondary | ICD-10-CM

## 2023-10-24 ENCOUNTER — Other Ambulatory Visit: Payer: Self-pay | Admitting: Family Medicine

## 2023-10-25 ENCOUNTER — Other Ambulatory Visit: Payer: Self-pay

## 2023-11-05 ENCOUNTER — Other Ambulatory Visit: Payer: Self-pay | Admitting: Family Medicine

## 2023-12-01 ENCOUNTER — Encounter: Payer: Self-pay | Admitting: Family Medicine

## 2023-12-22 DIAGNOSIS — Z79899 Other long term (current) drug therapy: Secondary | ICD-10-CM | POA: Diagnosis not present

## 2023-12-22 DIAGNOSIS — Z125 Encounter for screening for malignant neoplasm of prostate: Secondary | ICD-10-CM | POA: Diagnosis not present

## 2023-12-22 DIAGNOSIS — Z0000 Encounter for general adult medical examination without abnormal findings: Secondary | ICD-10-CM | POA: Diagnosis not present

## 2023-12-22 DIAGNOSIS — I10 Essential (primary) hypertension: Secondary | ICD-10-CM | POA: Diagnosis not present

## 2023-12-22 DIAGNOSIS — E785 Hyperlipidemia, unspecified: Secondary | ICD-10-CM | POA: Diagnosis not present

## 2023-12-23 ENCOUNTER — Encounter: Payer: Self-pay | Admitting: Family Medicine

## 2023-12-23 LAB — LIPID PANEL
Chol/HDL Ratio: 2.6 {ratio} (ref 0.0–5.0)
Cholesterol, Total: 83 mg/dL — ABNORMAL LOW (ref 100–199)
HDL: 32 mg/dL — ABNORMAL LOW (ref >39)
LDL Chol Calc (NIH): 36 mg/dL (ref 0–99)
Triglycerides: 65 mg/dL (ref 0–149)
VLDL Cholesterol Cal: 15 mg/dL (ref 5–40)

## 2023-12-23 LAB — BASIC METABOLIC PANEL
BUN/Creatinine Ratio: 12 (ref 10–24)
BUN: 14 mg/dL (ref 8–27)
CO2: 24 mmol/L (ref 20–29)
Calcium: 9.7 mg/dL (ref 8.6–10.2)
Chloride: 105 mmol/L (ref 96–106)
Creatinine, Ser: 1.18 mg/dL (ref 0.76–1.27)
Glucose: 90 mg/dL (ref 70–99)
Potassium: 4.6 mmol/L (ref 3.5–5.2)
Sodium: 143 mmol/L (ref 134–144)
eGFR: 70 mL/min/{1.73_m2} (ref >59)

## 2023-12-23 LAB — HEPATIC FUNCTION PANEL
ALT: 20 [IU]/L (ref 0–44)
AST: 25 [IU]/L (ref 0–40)
Albumin: 4.9 g/dL (ref 3.9–4.9)
Alkaline Phosphatase: 87 [IU]/L (ref 44–121)
Bilirubin Total: 0.8 mg/dL (ref 0.0–1.2)
Bilirubin, Direct: 0.31 mg/dL (ref 0.00–0.40)
Total Protein: 6.7 g/dL (ref 6.0–8.5)

## 2023-12-23 LAB — PSA: Prostate Specific Ag, Serum: 0.4 ng/mL (ref 0.0–4.0)

## 2023-12-27 ENCOUNTER — Ambulatory Visit: Payer: BC Managed Care – PPO | Admitting: Family Medicine

## 2023-12-27 ENCOUNTER — Encounter: Payer: Self-pay | Admitting: Family Medicine

## 2023-12-27 VITALS — BP 126/78 | HR 58 | Temp 97.4°F | Ht 71.0 in | Wt 220.0 lb

## 2023-12-27 DIAGNOSIS — R002 Palpitations: Secondary | ICD-10-CM | POA: Diagnosis not present

## 2023-12-27 DIAGNOSIS — E785 Hyperlipidemia, unspecified: Secondary | ICD-10-CM

## 2023-12-27 DIAGNOSIS — Z0001 Encounter for general adult medical examination with abnormal findings: Secondary | ICD-10-CM | POA: Diagnosis not present

## 2023-12-27 DIAGNOSIS — R931 Abnormal findings on diagnostic imaging of heart and coronary circulation: Secondary | ICD-10-CM

## 2023-12-27 DIAGNOSIS — Z8739 Personal history of other diseases of the musculoskeletal system and connective tissue: Secondary | ICD-10-CM

## 2023-12-27 DIAGNOSIS — Z0000 Encounter for general adult medical examination without abnormal findings: Secondary | ICD-10-CM

## 2023-12-27 MED ORDER — ROSUVASTATIN CALCIUM 10 MG PO TABS: ORAL_TABLET | ORAL | 1 refills | Status: AC

## 2023-12-27 MED ORDER — COLCHICINE 0.6 MG PO TABS: ORAL_TABLET | ORAL | 1 refills | Status: AC

## 2023-12-27 NOTE — Progress Notes (Signed)
 Subjective:    Patient ID: Stephen Barnes, male    DOB: 17-Nov-1960, 63 y.o.   MRN: [id number]  HPI The patient comes in today for a wellness visit.    A review of their health history was completed.  A review of medications was also completed.  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   Additional concerns: Discussion regarding occasional Gregary Lean patient trying to eat healthy minimizing processed foods    Review of Systems     Objective:   Physical Exam  General-in no acute distress Eyes-no discharge Lungs-respiratory rate normal, CTA CV-no murmurs,RRR Extremities skin warm dry no edema Neuro grossly normal Behavior normal, alert Prostate exam normal  Results for orders placed or performed in visit on 09/23/23  Basic Metabolic Panel   Collection Time: 12/22/23  8:09 AM  Result Value Ref Range   Glucose 90 70 - 99 mg/dL   BUN 14 8 - 27 mg/dL   Creatinine, Ser 1.19 0.76 - 1.27 mg/dL   eGFR 70 >14 NW/GNF/6.21   BUN/Creatinine Ratio 12 10 - 24   Sodium 143 134 - 144 mmol/L   Potassium 4.6 3.5 - 5.2 mmol/L   Chloride 105 96 - 106 mmol/L   CO2 24 20 - 29 mmol/L   Calcium  9.7 8.6 - 10.2 mg/dL  Lipid Panel   Collection Time: 12/22/23  8:09 AM  Result Value Ref Range   Cholesterol, Total 83 (L) 100 - 199 mg/dL   Triglycerides 65 0 - 149 mg/dL   HDL 32 (L) >30 mg/dL   VLDL Cholesterol Cal 15 5 - 40 mg/dL   LDL Chol Calc (NIH) 36 0 - 99 mg/dL   Chol/HDL Ratio 2.6 0.0 - 5.0 ratio  Hepatic Function Panel   Collection Time: 12/22/23  8:09 AM  Result Value Ref Range   Total Protein 6.7 6.0 - 8.5 g/dL   Albumin 4.9 3.9 - 4.9 g/dL   Bilirubin  Total 0.8 0.0 - 1.2 mg/dL   Bilirubin, Direct 8.65 0.00 - 0.40 mg/dL   Alkaline Phosphatase 87 44 - 121 IU/L   AST 25 0 - 40 IU/L   ALT 20 0 - 44 IU/L  PSA   Collection Time: 12/22/23  8:09 AM  Result Value Ref Range   Prostate Specific Ag, Serum 0.4 0.0 - 4.0 ng/mL         Assessment & Plan:  1. Well adult exam (Primary) Adult wellness-complete.wellness physical was conducted today. Importance of diet and exercise were discussed in detail.  Importance of stress reduction and healthy living were discussed.  In addition to this a discussion regarding safety was also covered.  We also reviewed over immunizations and gave recommendations regarding current immunization needed for age.   In addition to this additional areas were also touched on including: Preventative health exams needed:  Colonoscopy 2028  Patient was advised yearly wellness exam   2. Hyperlipidemia, unspecified hyperlipidemia type Trying to keep LDL below 55.  Currently LDL very good control continue statin continue healthy eating  3. Elevated coronary artery calcium  score This condition was reviewed with patient continue current measures  4. Palpitation Patient relates occasional  palpitation where he feels his heart flip-flopping but does not appear to be out of rhythm according to the patient blood pressure in good control currently heart rate sounds good on exam EKG shows normal sinus rhythm no atrial fibs - EKG 12-Lead  5. History of gout Recommend uric acid level on next lab work along with relooking at cholesterol profile  Follow-up 6 months

## 2024-06-02 ENCOUNTER — Encounter: Payer: Self-pay | Admitting: Advanced Practice Midwife

## 2024-06-26 ENCOUNTER — Ambulatory Visit: Payer: BC Managed Care – PPO | Admitting: Family Medicine
